# Patient Record
Sex: Female | Born: 1965 | Hispanic: Yes | Marital: Married | Smoking: Never smoker
Health system: Southern US, Community
[De-identification: ages and names within clinical notes are randomized; demographics above are authoritative.]

## PROBLEM LIST (undated history)

## (undated) HISTORY — PX: APPENDECTOMY: SHX54

---

## 2017-07-03 DIAGNOSIS — M5441 Lumbago with sciatica, right side: Secondary | ICD-10-CM | POA: Diagnosis not present

## 2017-07-03 DIAGNOSIS — M542 Cervicalgia: Secondary | ICD-10-CM | POA: Diagnosis not present

## 2017-07-07 DIAGNOSIS — E785 Hyperlipidemia, unspecified: Secondary | ICD-10-CM | POA: Diagnosis not present

## 2017-07-07 DIAGNOSIS — R5383 Other fatigue: Secondary | ICD-10-CM | POA: Diagnosis not present

## 2017-07-07 DIAGNOSIS — Z79899 Other long term (current) drug therapy: Secondary | ICD-10-CM | POA: Diagnosis not present

## 2017-07-18 DIAGNOSIS — M5441 Lumbago with sciatica, right side: Secondary | ICD-10-CM | POA: Diagnosis not present

## 2017-07-18 DIAGNOSIS — M542 Cervicalgia: Secondary | ICD-10-CM | POA: Diagnosis not present

## 2017-07-24 DIAGNOSIS — R1031 Right lower quadrant pain: Secondary | ICD-10-CM | POA: Diagnosis not present

## 2017-07-24 DIAGNOSIS — J45909 Unspecified asthma, uncomplicated: Secondary | ICD-10-CM | POA: Diagnosis not present

## 2017-07-24 DIAGNOSIS — R197 Diarrhea, unspecified: Secondary | ICD-10-CM | POA: Diagnosis not present

## 2017-07-24 DIAGNOSIS — K3580 Unspecified acute appendicitis: Secondary | ICD-10-CM | POA: Diagnosis not present

## 2017-07-24 DIAGNOSIS — Z79899 Other long term (current) drug therapy: Secondary | ICD-10-CM | POA: Diagnosis not present

## 2017-07-24 DIAGNOSIS — K353 Acute appendicitis with localized peritonitis, without perforation or gangrene: Secondary | ICD-10-CM | POA: Diagnosis not present

## 2017-07-25 DIAGNOSIS — K3580 Unspecified acute appendicitis: Secondary | ICD-10-CM | POA: Diagnosis not present

## 2017-07-25 DIAGNOSIS — R1031 Right lower quadrant pain: Secondary | ICD-10-CM | POA: Diagnosis not present

## 2017-07-25 DIAGNOSIS — J45909 Unspecified asthma, uncomplicated: Secondary | ICD-10-CM | POA: Diagnosis not present

## 2017-07-25 DIAGNOSIS — Z79899 Other long term (current) drug therapy: Secondary | ICD-10-CM | POA: Diagnosis not present

## 2017-07-25 DIAGNOSIS — K353 Acute appendicitis with localized peritonitis, without perforation or gangrene: Secondary | ICD-10-CM | POA: Diagnosis not present

## 2017-07-26 DIAGNOSIS — J45909 Unspecified asthma, uncomplicated: Secondary | ICD-10-CM | POA: Diagnosis not present

## 2017-07-26 DIAGNOSIS — K353 Acute appendicitis with localized peritonitis, without perforation or gangrene: Secondary | ICD-10-CM | POA: Diagnosis not present

## 2017-07-26 DIAGNOSIS — Z79899 Other long term (current) drug therapy: Secondary | ICD-10-CM | POA: Diagnosis not present

## 2017-07-26 DIAGNOSIS — K3580 Unspecified acute appendicitis: Secondary | ICD-10-CM | POA: Diagnosis not present

## 2017-07-28 DIAGNOSIS — R05 Cough: Secondary | ICD-10-CM | POA: Diagnosis not present

## 2017-07-28 DIAGNOSIS — Z6830 Body mass index (BMI) 30.0-30.9, adult: Secondary | ICD-10-CM | POA: Diagnosis not present

## 2017-07-28 DIAGNOSIS — B9689 Other specified bacterial agents as the cause of diseases classified elsewhere: Secondary | ICD-10-CM | POA: Diagnosis not present

## 2017-07-28 DIAGNOSIS — J208 Acute bronchitis due to other specified organisms: Secondary | ICD-10-CM | POA: Diagnosis not present

## 2017-07-29 DIAGNOSIS — R112 Nausea with vomiting, unspecified: Secondary | ICD-10-CM | POA: Diagnosis not present

## 2017-07-29 DIAGNOSIS — R51 Headache: Secondary | ICD-10-CM | POA: Diagnosis not present

## 2017-08-11 DIAGNOSIS — J208 Acute bronchitis due to other specified organisms: Secondary | ICD-10-CM | POA: Diagnosis not present

## 2017-08-11 DIAGNOSIS — B9689 Other specified bacterial agents as the cause of diseases classified elsewhere: Secondary | ICD-10-CM | POA: Diagnosis not present

## 2017-08-11 DIAGNOSIS — M542 Cervicalgia: Secondary | ICD-10-CM | POA: Diagnosis not present

## 2017-12-29 DIAGNOSIS — R062 Wheezing: Secondary | ICD-10-CM | POA: Diagnosis not present

## 2017-12-29 DIAGNOSIS — J040 Acute laryngitis: Secondary | ICD-10-CM | POA: Diagnosis not present

## 2018-01-04 DIAGNOSIS — J028 Acute pharyngitis due to other specified organisms: Secondary | ICD-10-CM | POA: Diagnosis not present

## 2018-01-04 DIAGNOSIS — B9689 Other specified bacterial agents as the cause of diseases classified elsewhere: Secondary | ICD-10-CM | POA: Diagnosis not present

## 2018-03-28 DIAGNOSIS — M5126 Other intervertebral disc displacement, lumbar region: Secondary | ICD-10-CM | POA: Diagnosis not present

## 2018-03-28 DIAGNOSIS — M5441 Lumbago with sciatica, right side: Secondary | ICD-10-CM | POA: Diagnosis not present

## 2018-03-28 DIAGNOSIS — M7601 Gluteal tendinitis, right hip: Secondary | ICD-10-CM | POA: Diagnosis not present

## 2018-03-28 DIAGNOSIS — M47816 Spondylosis without myelopathy or radiculopathy, lumbar region: Secondary | ICD-10-CM | POA: Diagnosis not present

## 2018-03-28 DIAGNOSIS — M25551 Pain in right hip: Secondary | ICD-10-CM | POA: Diagnosis not present

## 2018-03-29 ENCOUNTER — Emergency Department (HOSPITAL_COMMUNITY): Payer: Commercial Managed Care - PPO

## 2018-03-29 ENCOUNTER — Encounter: Payer: Self-pay | Admitting: Emergency Medicine

## 2018-03-29 ENCOUNTER — Emergency Department (HOSPITAL_COMMUNITY)
Admission: EM | Admit: 2018-03-29 | Discharge: 2018-03-30 | Disposition: A | Discharge status: Home / Self Care | Payer: Commercial Managed Care - PPO | Attending: Emergency Medicine | Admitting: Emergency Medicine

## 2018-03-29 DIAGNOSIS — Y939 Activity, unspecified: Secondary | ICD-10-CM | POA: Insufficient documentation

## 2018-03-29 DIAGNOSIS — M25551 Pain in right hip: Secondary | ICD-10-CM

## 2018-03-29 DIAGNOSIS — S73101A Unspecified sprain of right hip, initial encounter: Secondary | ICD-10-CM | POA: Diagnosis not present

## 2018-03-29 DIAGNOSIS — S73191A Other sprain of right hip, initial encounter: Secondary | ICD-10-CM | POA: Insufficient documentation

## 2018-03-29 DIAGNOSIS — Y929 Unspecified place or not applicable: Secondary | ICD-10-CM | POA: Insufficient documentation

## 2018-03-29 DIAGNOSIS — S79911A Unspecified injury of right hip, initial encounter: Secondary | ICD-10-CM | POA: Diagnosis present

## 2018-03-29 DIAGNOSIS — X509XXA Other and unspecified overexertion or strenuous movements or postures, initial encounter: Secondary | ICD-10-CM | POA: Insufficient documentation

## 2018-03-29 DIAGNOSIS — S71011A Laceration without foreign body, right hip, initial encounter: Secondary | ICD-10-CM | POA: Diagnosis not present

## 2018-03-29 DIAGNOSIS — T148XXA Other injury of unspecified body region, initial encounter: Secondary | ICD-10-CM

## 2018-03-29 DIAGNOSIS — Y999 Unspecified external cause status: Secondary | ICD-10-CM | POA: Diagnosis not present

## 2018-03-29 LAB — CBC WITH DIFFERENTIAL/PLATELET
ABS IMMATURE GRANULOCYTES: 0.02 10*3/uL (ref 0.00–0.07)
Basophils Absolute: 0 10*3/uL (ref 0.0–0.1)
Basophils Relative: 0 %
Eosinophils Absolute: 0.1 10*3/uL (ref 0.0–0.5)
Eosinophils Relative: 1 %
HCT: 39 % (ref 36.0–46.0)
HEMOGLOBIN: 12.3 g/dL (ref 12.0–15.0)
IMMATURE GRANULOCYTES: 0 %
Lymphocytes Relative: 33 %
Lymphs Abs: 2.2 10*3/uL (ref 0.7–4.0)
MCH: 27.9 pg (ref 26.0–34.0)
MCHC: 31.5 g/dL (ref 30.0–36.0)
MCV: 88.4 fL (ref 80.0–100.0)
MONO ABS: 0.6 10*3/uL (ref 0.1–1.0)
Monocytes Relative: 8 %
Neutro Abs: 3.9 10*3/uL (ref 1.7–7.7)
Neutrophils Relative %: 58 %
Platelets: 270 10*3/uL (ref 150–400)
RBC: 4.41 MIL/uL (ref 3.87–5.11)
RDW: 13.9 % (ref 11.5–15.5)
WBC: 6.7 10*3/uL (ref 4.0–10.5)
nRBC: 0 % (ref 0.0–0.2)

## 2018-03-29 LAB — I-STAT BETA HCG BLOOD, ED (MC, WL, AP ONLY): I-stat hCG, quantitative: <5 m[IU]/mL (ref <5)

## 2018-03-29 LAB — BASIC METABOLIC PANEL
ANION GAP: 11 (ref 5–15)
BUN: 10 mg/dL (ref 6–20)
CALCIUM: 9.4 mg/dL (ref 8.9–10.3)
CHLORIDE: 105 mmol/L (ref 98–111)
CO2: 24 mmol/L (ref 22–32)
Creatinine, Ser: 0.66 mg/dL (ref 0.44–1.00)
GFR calc non Af Amer: >60 mL/min (ref >60)
Glucose, Bld: 80 mg/dL (ref 70–99)
Potassium: 3.7 mmol/L (ref 3.5–5.1)
SODIUM: 140 mmol/L (ref 135–145)

## 2018-03-29 LAB — SEDIMENTATION RATE: SED RATE: 54 mm/h — AB (ref 0–22)

## 2018-03-29 MED ORDER — HYDROCODONE-ACETAMINOPHEN 5-325 MG PO TABS
1.0000 | ORAL_TABLET | Freq: Once | ORAL | Status: AC
  Administered 2018-03-29: 1 via ORAL
  Filled 2018-03-29: qty 1

## 2018-03-29 MED ORDER — DIAZEPAM 2 MG PO TABS
2.0000 mg | ORAL_TABLET | Freq: Once | ORAL | Status: AC
  Administered 2018-03-29: 2 mg via ORAL
  Filled 2018-03-29: qty 1

## 2018-03-29 NOTE — Discharge Instructions (Signed)
Please see the information and instructions below regarding your visit.  Your diagnoses today include:  1. Right hip pain   2. Muscle tear   3. Tear of right acetabular labrum, initial encounter    Your work-up is very reassuring today.  Your MRI shows that she has some muscle tearing of the muscles of the right hip, and possible tear in the labrum, which is a coating around the joint.   Tests performed today include: See side panel of your discharge paperwork for testing performed today. Vital signs are listed at the bottom of these instructions.   Medications prescribed:    Take any prescribed medications only as prescribed, and any over the counter medications only as directed on the packaging.  Please continue to take the meloxicam as prescribed by [facility].  You may take 650 mg of Tylenol every 6 hours as needed for pain as well.  Do not exceed 4000 mg in 1 day.  Home care instructions:  Please follow any educational materials contained in this packet.   Please alternate ice and heat overlying the hip.  When you ice, do not apply for more than 20 minutes at a time, and place a towel in between your skin and the ice.   Follow-up instructions:  Please follow up with Dr. Jena Gauss of orthopedic surgery as soon as possible.  Return instructions:  Please return to the Emergency Department if you experience worsening symptoms.  Please return to the emergency department if you develop any worsening pain, redness or swelling around the hip joint, fever or chills with hip pain, or complete inability to move the hip. Please return if you have any other emergent concerns.  Additional Information:   Your vital signs today were: BP 121/76    Pulse 73    Temp 98.8 F (37.1 C) (Oral)    Resp 16    Ht 5\' 6"  (1.676 m)    Wt 80.7 kg    SpO2 99%    BMI 28.73 kg/m  If your blood pressure (BP) was elevated on multiple readings during this visit above 130 for the top number or above 80  for the bottom number, please have this repeated by your primary care provider within one month. --------------  Thank you for allowing [place] to participate in your care today.

## 2018-03-29 NOTE — ED Notes (Signed)
ED Provider at bedside. 

## 2018-03-29 NOTE — ED Notes (Signed)
Patient transported to MRI 

## 2018-03-29 NOTE — ED Triage Notes (Signed)
Pt presents with 3 day h/o R groin and hip pain.  Pt reports awakening with pain, denies any injury.  Pt was seen by PCP and referred to [facility] where CT was done.  Pt presents with imaging disc.

## 2018-03-29 NOTE — ED Provider Notes (Signed)
[facility] EMERGENCY DEPARTMENT Provider Note   CSN: [id number] Arrival date & time: 03/29/18  1329     History   Chief Complaint Chief Complaint  Patient presents with  . Groin Pain    HPI Savannah Warren is a 52 y.o. female.  HPI  Patient is a 52 year old female with no significant past medical history presenting for sudden onset right hip pain 2 days ago.  Patient reports that she woke up in the morning 2 days ago with severe pain in her anterior "deep inside" right hip that she reports as made her unable to walk for the past 2 days.  Patient reports he was evaluated by her primary care provider, who performed radiographs, and there was question of a possible compression fracture in the lumbar spine.  Patient was then referred to [facility] where she had CT of lumbar spine and CT WO contrast right lower extremity.  They identified calcified gluteal tendinopathy, but no signs of AVN, or acute bony abnormality of the hip.  Patient denies any fever or chills, erythema or warmth to the joint, lower abdominal pain, dysuria, urgency, or frequency.  Patient denies any history of diabetes, immunocompromise status, or IVDU.  Patient was prescribed meloxicam for the pain without relief.  History reviewed. No pertinent past medical history.  There are no active problems to display for this patient.   Past Surgical History:  Procedure Laterality Date  . APPENDECTOMY       OB History   None      Home Medications    Prior to Admission medications   Not on File    Family History History reviewed. No pertinent family history.  Social History Social History   Tobacco Use  . Smoking status: Never Smoker  . Smokeless tobacco: Never Used  Substance Use Topics  . Alcohol use: Not on file  . Drug use: Not on file     Allergies   Patient has no known allergies.   Review of Systems Review of Systems  Constitutional: Negative for chills and fever.   HENT: Negative for congestion and sore throat.   Eyes: Negative for visual disturbance.  Respiratory: Negative for cough, chest tightness and shortness of breath.   Cardiovascular: Negative for chest pain and leg swelling.  Gastrointestinal: Negative for abdominal pain, nausea and vomiting.  Genitourinary: Negative for dysuria and flank pain.  Musculoskeletal: Positive for arthralgias and myalgias. Negative for back pain and joint swelling.  Skin: Negative for rash.  Neurological: Negative for dizziness and syncope.     Physical Exam Updated Vital Signs BP 121/76   Pulse 70   Temp 98.8 F (37.1 C) (Oral)   Resp 16   Ht '5\' 6"'$  (1.676 m)   Wt 80.7 kg   SpO2 96%   BMI 28.73 kg/m   Physical Exam  Constitutional: She appears well-developed and well-nourished. No distress.  HENT:  Head: Normocephalic and atraumatic.  Mouth/Throat: Oropharynx is clear and moist.  Eyes: Pupils are equal, round, and reactive to light. Conjunctivae and EOM are normal.  Neck: Normal range of motion. Neck supple.  Cardiovascular: Normal rate, regular rhythm, S1 normal and S2 normal.  No murmur heard. Pulmonary/Chest: Effort normal and breath sounds normal. She has no wheezes. She has no rales.  Abdominal: Soft. She exhibits no distension. There is no tenderness. There is no guarding.  Musculoskeletal:  Right lower extremity exam: Patient has no tenderness to palpation of the anterior or posterior hip.  Reported  during exam the pain is "deep" inside.  Patient has exquisite pain to logrolling, and with external rotation maneuvers.  Patient also has exquisite pain to resisted extension of the hip.  Knee flexion and extension, and dorsiflexion and plantarflexion are intact to the right lower extremity.  Full sensation of the distal right lower extremity.  2+ DP and PT pulses of the right lower extremity.  Neurological: She is alert.  Cranial nerves grossly intact. Patient moves extremities symmetrically and  with good coordination.  Skin: Skin is warm and dry. No rash noted. No erythema.  Psychiatric: She has a normal mood and affect. Her behavior is normal. Judgment and thought content normal.  Nursing note and vitals reviewed.    ED Treatments / Results  Labs (all labs ordered are listed, but only abnormal results are displayed) Labs Reviewed  CBC WITH DIFFERENTIAL/PLATELET  URINALYSIS, ROUTINE W REFLEX MICROSCOPIC  BASIC METABOLIC PANEL  I-STAT BETA HCG BLOOD, ED ([facility], [facility], [facility] ONLY)    EKG None  Radiology Mr Hip Right Wo Contrast  Result Date: 03/29/2018 CLINICAL DATA:  Right groin pain.  No specific injury. EXAM: MR OF THE RIGHT HIP WITHOUT CONTRAST TECHNIQUE: Multiplanar, multisequence MR imaging was performed. No intravenous contrast was administered. COMPARISON:  None. FINDINGS: There are muscle tears surrounding the right hip. This mainly involves the gemellus, operator internus and quadratus femoris muscles. There is also mild peritendinosis and mild trochanteric bursitis. Both hips are normally located. No stress fracture or AVN. No hip joint effusion. No periarticular fluid collections to suggest a paralabral cyst. On the coronal and sagittal images findings suspicious for an anterior superior labral tear. No significant intrapelvic abnormalities are identified. The pubic symphysis and SI joints are intact. No pelvic fractures or bone lesions. IMPRESSION: 1. Muscle strain/partial tears involving the right hip muscles as detailed above. 2. No hip fracture or AVN.  No pelvic fractures. 3. Suspect superior anterior labral tear involving the right hip. Electronically Signed   By: Marijo Sanes M.D.   On: 03/29/2018 20:39    Procedures Procedures (including critical care time)  Medications Ordered in ED Medications - No data to display   Initial Impression / Assessment and Plan / ED Course  I have reviewed the triage vital signs and the nursing notes.  Pertinent labs & imaging  results that were available during my care of the patient were reviewed by me and considered in my medical decision making (see chart for details).  Clinical Course as of Mar 29 2334  Thu Mar 29, 2018  1744 Consulted with Dr. Doreatha Martin of orthopedic surgery.  Based on presentation, recommends MRI.  Appreciate his involvement in the care of this patient.   [AM]  1804 Consulted with radiologist, Dr. Randel Pigg, who based on patient's CT readings from yesterday does not recommend MRI with contrast.  We will proceed with ordering MRI without contrast.   [AM]    Clinical Course User Index [AM] Albesa Seen, PA-C    Patient is nontoxic-appearing, afebrile, and in no acute distress.  Examination not consistent with septic arthritis, and patient has no major risk factors for osteomyelitis.  Patient has documented exams without evidence of AVN yesterday.  The frontal diagnosis includes occult stress fracture, labrum tear, piriformis syndrome, tendinopathy, or muscle tearing.  MRI performed without contrast on demonstrating muscle tears at the gemellus, operating tenderness and quadratus femoris muscles.  Suspect possible ligament tear.  Lab work demonstrated no leukocytosis.  Mild elevation in ESR, nonspecific.  This was discussed with attending physician, Dr. Pryor Curia, and felt unlikely to be related to infectious process.  Likely false positive.  CRP is pending, and care signed out to Savannah Moras, PA-C at shift change to follow CRP but if non-elevated/minimally elevated, patient stable for discharge.   Patient to follow-up with Dr. Doreatha Martin of orthopedics and sports medicine.  Patient can return precautions any worsening pain, swelling or redness of the hip, loss of range of motion, or fever or chills.  Patient and family are in understanding and agree with plan of care.  This is a shared visit with Dr. Pattricia Boss. Patient was independently evaluated by this attending physician. Attending physician  consulted in evaluation and management.  Final Clinical Impressions(s) / ED Diagnoses   Final diagnoses:  Right hip pain  Muscle tear  Tear of right acetabular labrum, initial encounter    ED Discharge Orders    None       Tamala Julian 03/29/18 2349    Pattricia Boss, MD 03/30/18 1426

## 2018-03-29 NOTE — ED Notes (Signed)
Pt returned from MRI °

## 2018-03-29 NOTE — ED Provider Notes (Signed)
Patient placed in Quick Look pathway, seen and evaluated   Chief Complaint: right groin pain  HPI:  Savannah Warren is a 52 y.o. female who presents to the ED with right groin pain. The pain started 3 days ago. Patient with no known injury. Reports she woke with the pain. She did have similar pain a couple years ago when she pulled a muscle. Patient went to [facility] to see her PCP and was sent to [place] and had x-rays. Patient also had CT scan. Patient here for second opinion and request MRI.   ROS: M/S: right hip and groin pain  Physical Exam:  BP 109/73 (BP Location: Right Arm)   Pulse 66   Temp 98.8 F (37.1 C) (Oral)   Resp 16   Ht 5\' 6"  (1.676 m)   Wt 80.7 kg   SpO2 100%   BMI 28.73 kg/m    Gen: No distress  Neuro: Awake and Alert  Skin: Warm and dry  M/S: I am unable to reproduce the pain that the patient reports she has when she stands and walks.    Initiation of care has begun. The patient has been counseled on the process, plan, and necessity for staying for the completion/evaluation, and the remainder of the medical screening examination    Janne Napoleon, NP 03/29/18 1451    Lorre Nick, MD 03/30/18 [phone]

## 2018-03-30 LAB — C-REACTIVE PROTEIN: CRP: 5.1 mg/dL — AB (ref <1.0)

## 2018-03-30 MED ORDER — HYDROCODONE-ACETAMINOPHEN 5-325 MG PO TABS: 1.0000 | ORAL_TABLET | Freq: Four times a day (QID) | ORAL | 0 refills | Status: DC | PRN

## 2018-03-30 NOTE — ED Provider Notes (Signed)
Received signout at the beginning of shift.  This is a 52 year old female without any significant past medical history presenting with acute onset of right hip pain which started 2 to 3 days ago.  Pain is intense, she is having difficulty walking.  Orthopedic, Dr. Jena Gauss who recommends MRI for evaluation as well as sed rate and CRP.  MRI shows suspected superior anterior labral tear involving the right hip.  Both sed rate and CRP are elevated.  Will consult orthopedic for further recommendation.  12:19 AM Appreciate consultation from ortho Dr. Jena Gauss who had reviewed MRI result and acknowledge SED RATE and CRP.  He felt sxs likely due to muscle tear.  He recommends NsaidS and opiate for sxs control.  Pt can f/u outpt as needed.  No further work up necessary.  Pt is made aware of finding and agrees with plan.   In order to decrease risk of narcotic abuse. Pt's record were checked using the  Controlled Substance database.   BP 121/76   Pulse 73   Temp 98.8 F (37.1 C) (Oral)   Resp 16   Ht 5\' 6"  (1.676 m)   Wt 80.7 kg   SpO2 99%   BMI 28.73 kg/m   Results for orders placed or performed during the hospital encounter of 03/29/18  CBC with Differential  Result Value Ref Range   WBC 6.7 4.0 - 10.5 K/uL   RBC 4.41 3.87 - 5.11 MIL/uL   Hemoglobin 12.3 12.0 - 15.0 g/dL   HCT 96.0 45.4 - 09.8 %   MCV 88.4 80.0 - 100.0 fL   MCH 27.9 26.0 - 34.0 pg   MCHC 31.5 30.0 - 36.0 g/dL   RDW 11.9 14.7 - 82.9 %   Platelets 270 150 - 400 K/uL   nRBC 0.0 0.0 - 0.2 %   Neutrophils Relative % 58 %   Neutro Abs 3.9 1.7 - 7.7 K/uL   Lymphocytes Relative 33 %   Lymphs Abs 2.2 0.7 - 4.0 K/uL   Monocytes Relative 8 %   Monocytes Absolute 0.6 0.1 - 1.0 K/uL   Eosinophils Relative 1 %   Eosinophils Absolute 0.1 0.0 - 0.5 K/uL   Basophils Relative 0 %   Basophils Absolute 0.0 0.0 - 0.1 K/uL   Immature Granulocytes 0 %   Abs Immature Granulocytes 0.02 0.00 - 0.07 K/uL  Basic metabolic panel  Result  Value Ref Range   Sodium 140 135 - 145 mmol/L   Potassium 3.7 3.5 - 5.1 mmol/L   Chloride 105 98 - 111 mmol/L   CO2 24 22 - 32 mmol/L   Glucose, Bld 80 70 - 99 mg/dL   BUN 10 6 - 20 mg/dL   Creatinine, Ser 5.62 0.44 - 1.00 mg/dL   Calcium 9.4 8.9 - 13.0 mg/dL   GFR calc non Af Amer >60 >60 mL/min   GFR calc Af Amer >60 >60 mL/min   Anion gap 11 5 - 15  C-reactive protein  Result Value Ref Range   CRP 5.1 (H) <1.0 mg/dL  Sedimentation rate  Result Value Ref Range   Sed Rate 54 (H) 0 - 22 mm/hr  I-Stat Beta hCG blood, ED ([facility], [facility], [facility] only)  Result Value Ref Range   I-stat hCG, quantitative <5.0 <5 mIU/mL   Comment 3           Mr Hip Right Wo Contrast  Result Date: 03/29/2018 CLINICAL DATA:  Right groin pain.  No specific injury. EXAM: MR OF THE  RIGHT HIP WITHOUT CONTRAST TECHNIQUE: Multiplanar, multisequence MR imaging was performed. No intravenous contrast was administered. COMPARISON:  None. FINDINGS: There are muscle tears surrounding the right hip. This mainly involves the gemellus, operator internus and quadratus femoris muscles. There is also mild peritendinosis and mild trochanteric bursitis. Both hips are normally located. No stress fracture or AVN. No hip joint effusion. No periarticular fluid collections to suggest a paralabral cyst. On the coronal and sagittal images findings suspicious for an anterior superior labral tear. No significant intrapelvic abnormalities are identified. The pubic symphysis and SI joints are intact. No pelvic fractures or bone lesions. IMPRESSION: 1. Muscle strain/partial tears involving the right hip muscles as detailed above. 2. No hip fracture or AVN.  No pelvic fractures. 3. Suspect superior anterior labral tear involving the right hip. Electronically Signed   By: Rudie Meyer M.D.   On: 03/29/2018 20:39       Fayrene Helper, PA-C 03/30/18 0030    Terrilee Files, MD 03/30/18 [phone]

## 2018-04-16 DIAGNOSIS — S76011A Strain of muscle, fascia and tendon of right hip, initial encounter: Secondary | ICD-10-CM | POA: Diagnosis not present

## 2018-12-28 DIAGNOSIS — R911 Solitary pulmonary nodule: Secondary | ICD-10-CM | POA: Insufficient documentation

## 2020-12-27 ENCOUNTER — Emergency Department (HOSPITAL_COMMUNITY): Payer: Commercial Managed Care - PPO

## 2020-12-27 ENCOUNTER — Other Ambulatory Visit: Payer: Self-pay

## 2020-12-27 ENCOUNTER — Encounter (HOSPITAL_COMMUNITY): Payer: Self-pay | Admitting: Emergency Medicine

## 2020-12-27 ENCOUNTER — Emergency Department (HOSPITAL_COMMUNITY)
Admission: EM | Admit: 2020-12-27 | Discharge: 2020-12-27 | Disposition: A | Discharge status: Home / Self Care | Payer: Commercial Managed Care - PPO | Attending: Emergency Medicine | Admitting: Emergency Medicine

## 2020-12-27 DIAGNOSIS — R1032 Left lower quadrant pain: Secondary | ICD-10-CM | POA: Insufficient documentation

## 2020-12-27 DIAGNOSIS — M549 Dorsalgia, unspecified: Secondary | ICD-10-CM

## 2020-12-27 DIAGNOSIS — M5432 Sciatica, left side: Secondary | ICD-10-CM

## 2020-12-27 DIAGNOSIS — M545 Low back pain, unspecified: Secondary | ICD-10-CM | POA: Insufficient documentation

## 2020-12-27 LAB — CBC WITH DIFFERENTIAL/PLATELET
Abs Immature Granulocytes: 0.04 10*3/uL (ref 0.00–0.07)
Basophils Absolute: 0 10*3/uL (ref 0.0–0.1)
Basophils Relative: 0 %
Eosinophils Absolute: 0.1 10*3/uL (ref 0.0–0.5)
Eosinophils Relative: 2 %
HCT: 39.8 % (ref 36.0–46.0)
Hemoglobin: 12.9 g/dL (ref 12.0–15.0)
Immature Granulocytes: 1 %
Lymphocytes Relative: 36 %
Lymphs Abs: 2.3 10*3/uL (ref 0.7–4.0)
MCH: 28.2 pg (ref 26.0–34.0)
MCHC: 32.4 g/dL (ref 30.0–36.0)
MCV: 86.9 fL (ref 80.0–100.0)
Monocytes Absolute: 0.5 10*3/uL (ref 0.1–1.0)
Monocytes Relative: 7 %
Neutro Abs: 3.5 10*3/uL (ref 1.7–7.7)
Neutrophils Relative %: 54 %
Platelets: 289 10*3/uL (ref 150–400)
RBC: 4.58 MIL/uL (ref 3.87–5.11)
RDW: 13.2 % (ref 11.5–15.5)
WBC: 6.4 10*3/uL (ref 4.0–10.5)
nRBC: 0 % (ref 0.0–0.2)

## 2020-12-27 LAB — URINALYSIS, ROUTINE W REFLEX MICROSCOPIC
Bilirubin Urine: NEGATIVE
Glucose, UA: NEGATIVE mg/dL
Hgb urine dipstick: NEGATIVE
Ketones, ur: NEGATIVE mg/dL
Leukocytes,Ua: NEGATIVE
Nitrite: NEGATIVE
Protein, ur: NEGATIVE mg/dL
Specific Gravity, Urine: 1.003 — ABNORMAL LOW (ref 1.005–1.030)
pH: 8 (ref 5.0–8.0)

## 2020-12-27 LAB — BASIC METABOLIC PANEL
Anion gap: 10 (ref 5–15)
BUN: 17 mg/dL (ref 6–20)
CO2: 22 mmol/L (ref 22–32)
Calcium: 9.2 mg/dL (ref 8.9–10.3)
Chloride: 104 mmol/L (ref 98–111)
Creatinine, Ser: 0.61 mg/dL (ref 0.44–1.00)
GFR, Estimated: >60 mL/min (ref >60)
Glucose, Bld: 93 mg/dL (ref 70–99)
Potassium: 3 mmol/L — ABNORMAL LOW (ref 3.5–5.1)
Sodium: 136 mmol/L (ref 135–145)

## 2020-12-27 LAB — I-STAT BETA HCG BLOOD, ED (MC, WL, AP ONLY): I-stat hCG, quantitative: <5 m[IU]/mL (ref <5)

## 2020-12-27 MED ORDER — OXYCODONE HCL 5 MG PO TABS: 5.0000 mg | ORAL_TABLET | ORAL | 0 refills | Status: AC | PRN

## 2020-12-27 MED ORDER — ACETAMINOPHEN 325 MG PO TABS: 650.0000 mg | ORAL_TABLET | Freq: Four times a day (QID) | ORAL | 0 refills | Status: AC | PRN

## 2020-12-27 MED ORDER — PREDNISONE 10 MG PO TABS: 40.0000 mg | ORAL_TABLET | Freq: Every day | ORAL | 0 refills | Status: AC

## 2020-12-27 MED ORDER — HYDROMORPHONE HCL 1 MG/ML IJ SOLN
1.0000 mg | Freq: Once | INTRAMUSCULAR | Status: AC
  Administered 2020-12-27: 1 mg via INTRAVENOUS
  Filled 2020-12-27: qty 1

## 2020-12-27 MED ORDER — DOCUSATE SODIUM 100 MG PO CAPS: 100.0000 mg | ORAL_CAPSULE | Freq: Every day | ORAL | 0 refills | Status: AC | PRN

## 2020-12-27 MED ORDER — IOHEXOL 350 MG/ML SOLN
100.0000 mL | Freq: Once | INTRAVENOUS | Status: AC | PRN
  Administered 2020-12-27: 100 mL via INTRAVENOUS

## 2020-12-27 NOTE — ED Provider Notes (Signed)
[facility] EMERGENCY DEPARTMENT Provider Note   CSN: [id number] Arrival date & time: 12/27/20  1324     History YC:XKGY pain   Savannah Warren is a 55 y.o. female presented emergency department with left lower back pain and flank pain.  Patient reports abrupt onset of symptoms today when she is getting out of the shower.  She said the pain was so severe it dropped her to the ground.  She has never had it before.  It is a stabbing pain in her left flank that radiates towards her left buttock.  She denies dysuria, hematuria, history of kidney stones.  She denies any falls or trauma.  She reports the pain is 10 out of 10, worse with movement, better when laying completely still.  Patient is primarily Spanish-speaking, to speak some [place], her husband by phone helps with translation.  She otherwise has no subsequent medical problems.  The pain does not radiate down her leg or travel anywhere.  HPI     History reviewed. No pertinent past medical history.  There are no problems to display for this patient.   Past Surgical History:  Procedure Laterality Date   APPENDECTOMY       OB History   No obstetric history on file.     No family history on file.  Social History   Tobacco Use   Smoking status: Never   Smokeless tobacco: Never  Substance Use Topics   Alcohol use: Not Currently   Drug use: Not Currently    Home Medications Prior to Admission medications   Medication Sig Start Date End Date Taking? Authorizing Provider  HYDROcodone-acetaminophen (NORCO/VICODIN) 5-325 MG tablet Take 1 tablet by mouth every 6 (six) hours as needed for moderate pain or severe pain. 03/30/18   Fayrene Helper, PA-C  meloxicam (MOBIC) 7.5 MG tablet Take 7.5 mg by mouth 2 (two) times daily. 03/28/18   [provider]    Allergies    Patient has no known allergies.  Review of Systems   Review of Systems  Constitutional:  Negative for chills and fever.  HENT:   Negative for ear pain and sore throat.   Eyes:  Negative for pain and visual disturbance.  Respiratory:  Negative for cough and shortness of breath.   Cardiovascular:  Negative for chest pain and palpitations.  Gastrointestinal:  Negative for abdominal pain and vomiting.  Genitourinary:  Negative for dysuria and hematuria.  Musculoskeletal:  Positive for arthralgias and back pain.  Skin:  Negative for color change and rash.  Neurological:  Negative for weakness and numbness.  All other systems reviewed and are negative.  Physical Exam Updated Vital Signs BP (!) 128/102   Pulse 84   Temp 98.4 F (36.9 C) (Oral)   Resp 17   SpO2 90%   Physical Exam Constitutional:      General: She is not in acute distress. HENT:     Head: Normocephalic and atraumatic.  Eyes:     Extraocular Movements: Extraocular movements intact.     Conjunctiva/sclera: Conjunctivae normal.     Pupils: Pupils are equal, round, and reactive to light.  Cardiovascular:     Rate and Rhythm: Normal rate and regular rhythm.     Pulses: Normal pulses.  Pulmonary:     Effort: Pulmonary effort is normal. No respiratory distress.  Abdominal:     General: There is no distension.     Tenderness: There is no abdominal tenderness.  Skin:    General:  Skin is warm and dry.  Neurological:     General: No focal deficit present.     Mental Status: She is alert and oriented to person, place, and time. Mental status is at baseline.     Sensory: No sensory deficit.     Motor: No weakness.     Comments: Pain worse with forward flexion at the hip  Psychiatric:        Mood and Affect: Mood normal.        Behavior: Behavior normal.    ED Results / Procedures / Treatments   Labs (all labs ordered are listed, but only abnormal results are displayed) Labs Reviewed  BASIC METABOLIC PANEL - Abnormal; Notable for the following components:      Result Value   Potassium 3.0 (*)    All other components within normal limits   CBC WITH DIFFERENTIAL/PLATELET  URINALYSIS, ROUTINE W REFLEX MICROSCOPIC  I-STAT BETA HCG BLOOD, ED ([facility], [facility], [facility] ONLY)    EKG None  Radiology No results found.  Procedures Procedures   Medications Ordered in ED Medications  HYDROmorphone (DILAUDID) injection 1 mg (1 mg Intravenous Given 12/27/20 1611)    ED Course  I have reviewed the triage vital signs and the nursing notes.  Pertinent labs & imaging results that were available during my care of the patient were reviewed by me and considered in my medical decision making (see chart for details).  Patient is here with left hip and flank pain.  Differential diagnosis includes sciatica or radiculopathy (most likely given a positional this is) versus ureteral colic and kidney stone versus other.  Otherwise clinically I doubt this is a AAA.  I have a low suspicion for intra-abdominal perforation or sepsis.  No red flags for cauda equina syndrome.  Labs show mild hypokalemia, no leukocytosis.  UA is pending.  IV Dilaudid given for pain.  CT scans ordered and pending.  UA without sign of infection.  Clinical Course as of 12/27/20 2258  Wynelle Link Dec 27, 2020  1806 Patient reports significant improvement of her pain.  She was able to ambulate steadily.  I spoke with both her and her husband on the phone, provided medications for pain control, she already sees a spine doctor oral and will follow up with him for this issue. [MT]  1810 Patient reports she cannot have NSAIDs because she has ulcers in her stomach. [MT]    Clinical Course User Index [MT] Lucielle Vokes, Kermit Balo, MD    Final Clinical Impression(s) / ED Diagnoses Final diagnoses:  Back pain    Rx / DC Orders ED Discharge Orders     None        Terald Sleeper, MD 12/27/20 2259

## 2020-12-27 NOTE — ED Notes (Signed)
Patient is resting comfortably. States pain much improved. All needs being met. VSS

## 2020-12-27 NOTE — Discharge Instructions (Addendum)
Please call your spine doctor's office tomorrow and ask for a follow up appointment.

## 2020-12-27 NOTE — ED Provider Notes (Signed)
Emergency Medicine Provider Triage Evaluation Note  Clyde Upshaw , a 55 y.o. female  was evaluated in triage.  Pt complains of flank pain. Began 30 min PTA. Located to left flank. No abd pain. Will occasional radiate into left glut and into leg. No numbness, weakness, IVDU, saddle paresthesias. Worse with movement. No hx of AAA, dissection. No CP, SOB, urinary complaints  Review of Systems  Positive: Flank pain Negative: CP, SOB, numbness, weakness  Physical Exam  BP (!) 127/97 (BP Location: Right Arm)   Pulse 81   Temp 97.6 F (36.4 C)   Resp (!) 22   SpO2 100%  Gen:   Awake, no distress   Resp:  Normal effort  MSK:   Moves extremities without difficulty, tenderness to left gluteal region, SI ABD:  Diffuse tenderness to left flank Other:    Medical Decision Making  Medically screening exam initiated at 1:38 PM.  Appropriate orders placed.  Shaeleigh Graw was informed that the remainder of the evaluation will be completed by another provider, this initial triage assessment does not replace that evaluation, and the importance of remaining in the ED until their evaluation is complete.  Flank pain, low back pain   Eve Rey A, PA-C 12/27/20 1340    Terald Sleeper, MD 12/27/20 2259

## 2020-12-27 NOTE — ED Notes (Signed)
ED Provider at bedside. 

## 2020-12-27 NOTE — ED Notes (Signed)
Pt NAD, a/ox4. Pt verbalizes relief with pain medicine and is now able to slowly ambulate. IV DC intact. Pt verbalizes understanding of all DC and f/u instructions. All questions answered. Pt wheeled to lobby where husband waiting for her

## 2020-12-27 NOTE — ED Triage Notes (Addendum)
C/o L flank pain x 40 min.  Denies nausea, vomiting, and urinary complaints.  PT appears to be in extreme pain.  Diaphoretic.

## 2020-12-27 NOTE — ED Notes (Signed)
Patient transported to CT 

## 2020-12-27 NOTE — ED Notes (Signed)
Pt able to ambulate slowly. MD made aware

## 2020-12-27 NOTE — ED Notes (Addendum)
Pt return from ct

## 2020-12-27 NOTE — ED Notes (Signed)
Pt unable to ambulate, groaning loudly with facial grimmacing. MD made aware

## 2021-02-10 DIAGNOSIS — M4722 Other spondylosis with radiculopathy, cervical region: Secondary | ICD-10-CM | POA: Insufficient documentation

## 2021-10-25 ENCOUNTER — Emergency Department (HOSPITAL_COMMUNITY)
Admission: EM | Admit: 2021-10-25 | Discharge: 2021-10-25 | Disposition: A | Discharge status: Home / Self Care | Payer: Commercial Managed Care - PPO | Attending: Emergency Medicine | Admitting: Emergency Medicine

## 2021-10-25 ENCOUNTER — Other Ambulatory Visit: Payer: Self-pay

## 2021-10-25 ENCOUNTER — Encounter (HOSPITAL_COMMUNITY): Payer: Self-pay | Admitting: Emergency Medicine

## 2021-10-25 ENCOUNTER — Emergency Department (HOSPITAL_COMMUNITY): Payer: Commercial Managed Care - PPO

## 2021-10-25 DIAGNOSIS — M47812 Spondylosis without myelopathy or radiculopathy, cervical region: Secondary | ICD-10-CM

## 2021-10-25 DIAGNOSIS — M25512 Pain in left shoulder: Secondary | ICD-10-CM

## 2021-10-25 DIAGNOSIS — M4722 Other spondylosis with radiculopathy, cervical region: Secondary | ICD-10-CM | POA: Insufficient documentation

## 2021-10-25 DIAGNOSIS — M546 Pain in thoracic spine: Secondary | ICD-10-CM | POA: Diagnosis present

## 2021-10-25 DIAGNOSIS — M5412 Radiculopathy, cervical region: Secondary | ICD-10-CM

## 2021-10-25 LAB — URINALYSIS, ROUTINE W REFLEX MICROSCOPIC
Bilirubin Urine: NEGATIVE
Glucose, UA: NEGATIVE mg/dL
Hgb urine dipstick: NEGATIVE
Ketones, ur: NEGATIVE mg/dL
Leukocytes,Ua: NEGATIVE
Nitrite: NEGATIVE
Protein, ur: NEGATIVE mg/dL
Specific Gravity, Urine: 1.004 — ABNORMAL LOW (ref 1.005–1.030)
pH: 5 (ref 5.0–8.0)

## 2021-10-25 LAB — CBC WITH DIFFERENTIAL/PLATELET
Abs Immature Granulocytes: 0.04 10*3/uL (ref 0.00–0.07)
Basophils Absolute: 0 10*3/uL (ref 0.0–0.1)
Basophils Relative: 0 %
Eosinophils Absolute: 0.1 10*3/uL (ref 0.0–0.5)
Eosinophils Relative: 1 %
HCT: 42.7 % (ref 36.0–46.0)
Hemoglobin: 13.2 g/dL (ref 12.0–15.0)
Immature Granulocytes: 0 %
Lymphocytes Relative: 28 %
Lymphs Abs: 2.6 10*3/uL (ref 0.7–4.0)
MCH: 27.6 pg (ref 26.0–34.0)
MCHC: 30.9 g/dL (ref 30.0–36.0)
MCV: 89.3 fL (ref 80.0–100.0)
Monocytes Absolute: 0.5 10*3/uL (ref 0.1–1.0)
Monocytes Relative: 5 %
Neutro Abs: 6 10*3/uL (ref 1.7–7.7)
Neutrophils Relative %: 66 %
Platelets: 301 10*3/uL (ref 150–400)
RBC: 4.78 MIL/uL (ref 3.87–5.11)
RDW: 14.1 % (ref 11.5–15.5)
WBC: 9.2 10*3/uL (ref 4.0–10.5)
nRBC: 0 % (ref 0.0–0.2)

## 2021-10-25 LAB — COMPREHENSIVE METABOLIC PANEL
ALT: 31 U/L (ref 0–44)
AST: 21 U/L (ref 15–41)
Albumin: 3.9 g/dL (ref 3.5–5.0)
Alkaline Phosphatase: 79 U/L (ref 38–126)
Anion gap: 8 (ref 5–15)
BUN: 14 mg/dL (ref 6–20)
CO2: 27 mmol/L (ref 22–32)
Calcium: 9.4 mg/dL (ref 8.9–10.3)
Chloride: 102 mmol/L (ref 98–111)
Creatinine, Ser: 0.7 mg/dL (ref 0.44–1.00)
GFR, Estimated: >60 mL/min (ref >60)
Glucose, Bld: 104 mg/dL — ABNORMAL HIGH (ref 70–99)
Potassium: 3.8 mmol/L (ref 3.5–5.1)
Sodium: 137 mmol/L (ref 135–145)
Total Bilirubin: 0.5 mg/dL (ref 0.3–1.2)
Total Protein: 7.6 g/dL (ref 6.5–8.1)

## 2021-10-25 LAB — TROPONIN I (HIGH SENSITIVITY): Troponin I (High Sensitivity): 5 ng/L (ref <18)

## 2021-10-25 MED ORDER — HYDROCODONE-ACETAMINOPHEN 5-325 MG PO TABS
2.0000 | ORAL_TABLET | Freq: Once | ORAL | Status: AC
  Administered 2021-10-25: 2 via ORAL
  Filled 2021-10-25: qty 2

## 2021-10-25 MED ORDER — PREDNISONE 10 MG PO TABS: ORAL_TABLET | ORAL | 0 refills | Status: AC

## 2021-10-25 MED ORDER — PREDNISONE 20 MG PO TABS
60.0000 mg | ORAL_TABLET | Freq: Once | ORAL | Status: AC
  Administered 2021-10-25: 60 mg via ORAL
  Filled 2021-10-25: qty 3

## 2021-10-25 MED ORDER — OXYCODONE-ACETAMINOPHEN 5-325 MG PO TABS: 1.0000 | ORAL_TABLET | Freq: Four times a day (QID) | ORAL | 0 refills | Status: DC | PRN

## 2021-10-25 NOTE — Discharge Instructions (Signed)
The pain which is in the area of your left scapula, is most likely radiating pain from the lower cervical spine where the nerve is irritated.  We are treating you with 2 medicines, to help both inflammation and pain.  Do not drive, or work, when taking the narcotic pain reliever, oxycodone.  Try using heat on the sore area 3-4 times a day.  Call your doctor at [facility] who previously worked with you on your neck pain.  They may be able to help you further.  We sent prescriptions to take to help your pain.

## 2021-10-25 NOTE — ED Provider Triage Note (Signed)
Emergency Medicine Provider Triage Evaluation Note  Abryana Lykens , a 56 y.o. female  was evaluated in triage.  Pt complains of mid upper back pain that wraps around and goes into the middle of her chest constantly for the past 10 days. Reports some occasional SOB. Denies any cough or cold symptoms. Denies any history of a blood clot. Denies any medical history. Denies any trauma to the area or any heavy lifting. .  Review of Systems  Positive:  Negative:   Physical Exam  BP 129/82   Pulse 82   Temp 98.1 F (36.7 C)   Resp 16   SpO2 100%  Gen:   Awake, no distress   Resp:  Normal effort  MSK:   Moves extremities without difficulty  Other:  RRR , CTAB  Medical Decision Making  Medically screening exam initiated at 6:45 PM.  Appropriate orders placed.  Consuello Lassalle was informed that the remainder of the evaluation will be completed by another provider, this initial triage assessment does not replace that evaluation, and the importance of remaining in the ED until their evaluation is complete.  Cardiac labs ordered   Achille Rich, Cordelia Poche 10/25/21 3888

## 2021-10-25 NOTE — ED Notes (Signed)
Lab called to report serum osmolality of 321.

## 2021-10-25 NOTE — ED Notes (Signed)
DC instructions reviewed with pt. Pt verbalized understanding.  Pt DC 

## 2021-10-25 NOTE — ED Provider Notes (Signed)
[facility] EMERGENCY DEPARTMENT Provider Note   CSN: [id number] Arrival date & time: 10/25/21  1805     History {Add pertinent medical, surgical, social history, OB history to HPI:1} Chief Complaint  Patient presents with   Back Pain    Savannah Warren is a 56 y.o. female.  HPI 9:33 PM.  Patient now in the room after being in the waiting room.  Patient presenting for evaluation of upper back pain that spreads from that location.  She locates the pain at the left subscapular region.  The pain is constant and sometimes worse with deep breathing but not with movement of the neck, arms or back.  No fever, chills, cough, shortness of breath, nausea or vomiting.  There are no other known modifying factors.     Home Medications Prior to Admission medications   Medication Sig Start Date End Date Taking? Authorizing Provider  acetaminophen (TYLENOL) 325 MG tablet Take 2 tablets (650 mg total) by mouth every 6 (six) hours as needed for up to 30 doses for mild pain or moderate pain. 12/27/20   Terald Sleeper, MD  docusate sodium (COLACE) 100 MG capsule Take 1 capsule (100 mg total) by mouth daily as needed for mild constipation. 12/27/20   Terald Sleeper, MD  HYDROcodone-acetaminophen (NORCO/VICODIN) 5-325 MG tablet Take 1 tablet by mouth every 6 (six) hours as needed for moderate pain or severe pain. 03/30/18   Fayrene Helper, PA-C  meloxicam (MOBIC) 7.5 MG tablet Take 7.5 mg by mouth 2 (two) times daily. 03/28/18   [provider]  oxyCODONE (ROXICODONE) 5 MG immediate release tablet Take 1 tablet (5 mg total) by mouth every 4 (four) hours as needed for up to 20 doses for severe pain. 12/27/20   Terald Sleeper, MD      Allergies    Patient has no known allergies.    Review of Systems   Review of Systems  Physical Exam Updated Vital Signs BP 129/82   Pulse 82   Temp 98.1 F (36.7 C)   Resp 16   SpO2 100%  Physical Exam Vitals and nursing note  reviewed.  Constitutional:      General: She is not in acute distress.    Appearance: She is well-developed. She is not ill-appearing or diaphoretic.  HENT:     Head: Normocephalic and atraumatic.     Right Ear: External ear normal.     Left Ear: External ear normal.  Eyes:     Conjunctiva/sclera: Conjunctivae normal.     Pupils: Pupils are equal, round, and reactive to light.  Neck:     Trachea: Phonation normal.  Cardiovascular:     Rate and Rhythm: Normal rate.  Pulmonary:     Effort: Pulmonary effort is normal.  Abdominal:     General: There is no distension.     Palpations: Abdomen is soft.  Musculoskeletal:        General: No swelling or tenderness. Normal range of motion.     Cervical back: Normal range of motion and neck supple.     Comments: Normal range of motion neck, arms and legs.  Skin:    General: Skin is warm and dry.  Neurological:     Mental Status: She is alert and oriented to person, place, and time.     Cranial Nerves: No cranial nerve deficit.     Sensory: No sensory deficit.     Motor: No abnormal muscle tone.  Coordination: Coordination normal.  Psychiatric:        Mood and Affect: Mood normal.        Behavior: Behavior normal.        Thought Content: Thought content normal.        Judgment: Judgment normal.    ED Results / Procedures / Treatments   Labs (all labs ordered are listed, but only abnormal results are displayed) Labs Reviewed  COMPREHENSIVE METABOLIC PANEL - Abnormal; Notable for the following components:      Result Value   Glucose, Bld 104 (*)    All other components within normal limits  URINALYSIS, ROUTINE W REFLEX MICROSCOPIC - Abnormal; Notable for the following components:   Color, Urine STRAW (*)    Specific Gravity, Urine 1.004 (*)    All other components within normal limits  CBC WITH DIFFERENTIAL/PLATELET  TROPONIN I (HIGH SENSITIVITY)  TROPONIN I (HIGH SENSITIVITY)    EKG EKG  Interpretation  Date/Time:  Monday Oct 25 2021 19:04:11 EDT Ventricular Rate:  80 PR Interval:  172 QRS Duration: 92 QT Interval:  370 QTC Calculation: 426 R Axis:   29 Text Interpretation: Normal sinus rhythm Incomplete right bundle branch block Borderline ECG When compared with ECG of 27-Dec-2020 13:41, PREVIOUS ECG IS PRESENT Since last tracing mild intraventricular conduction delay has occurred Otherwise no significant change Confirmed by Mancel Bale [phone]) on 10/25/2021 8:57:56 PM  Radiology DG Chest 2 View  Result Date: 10/25/2021 CLINICAL DATA:  Chest pain EXAM: CHEST - 2 VIEW COMPARISON:  12/05/2018 FINDINGS: Heart and mediastinal contours are within normal limits. No focal opacities or effusions. No acute bony abnormality. IMPRESSION: No active cardiopulmonary disease. Electronically Signed   By: Charlett Nose M.D.   On: 10/25/2021 19:22    Procedures Procedures  {Document cardiac monitor, telemetry assessment procedure when appropriate:1}  Medications Ordered in ED Medications - No data to display  ED Course/ Medical Decision Making/ A&P Clinical Course as of 10/25/21 2110  Mon Oct 25, 2021  2106 Not in room when I arrived to see her  [EW]    Clinical Course User Index [EW] Mancel Bale, MD                           Medical Decision Making Patient presenting with left subscapular pain, likely cervical radiculopathy based on prior history and clinical exam.  No suspicion for intrathoracic or thoracic spine disorders.  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  Amount and/or Complexity of Data Reviewed External Data Reviewed: radiology.    Details: MRI cervical spine, MRI lumbar spine, August 2023.   ***  {Document critical care time when appropriate:1} {Document review of labs and clinical decision tools ie heart score, Chads2Vasc2 etc:1}  {Document your independent  review of radiology images, and any outside records:1} {Document your discussion with family members, caretakers, and with consultants:1} {Document social determinants of health affecting pt's care:1} {Document your decision making why or why not admission, treatments were needed:1} Final Clinical Impression(s) / ED Diagnoses Final diagnoses:  None    Rx / DC Orders ED Discharge Orders     None

## 2021-10-25 NOTE — ED Triage Notes (Signed)
Patient coming from home, complaint of back pain that radiated to left flank and to chest for approx. 10 days. States that the pain does not get any worse but also has not gotten any better.

## 2021-11-21 IMAGING — CT CT L SPINE W/O CM
3 series · 12 of 33 positions shown, 14 images · IV contrast (APPLIED)
Comparison: CT abdomen and pelvis 12/05/2019.

CLINICAL DATA: Acute onset left flank pain today.  No known injury.

EXAM:
CT Lumbar Spine with contrast
TECHNIQUE: 
TECHNIQUE: Multiplanar CT images of the lumbar spine were
reconstructed from contemporary CT of the Abdomen and Pelvis.
CONTRAST:  No additional.

[Series 3: lspine axial soft tissue · axial · 0.34mm/px · z∈[-393,-235]mm · 4 of 115 slices shown, 5 images]
[im 18/115  soft-tissue]
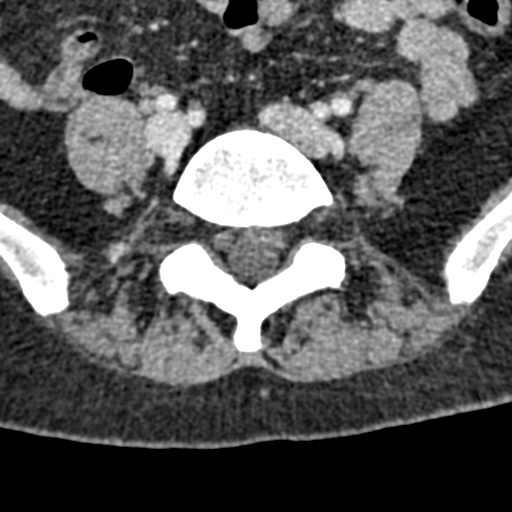
[im 18/115  bone]
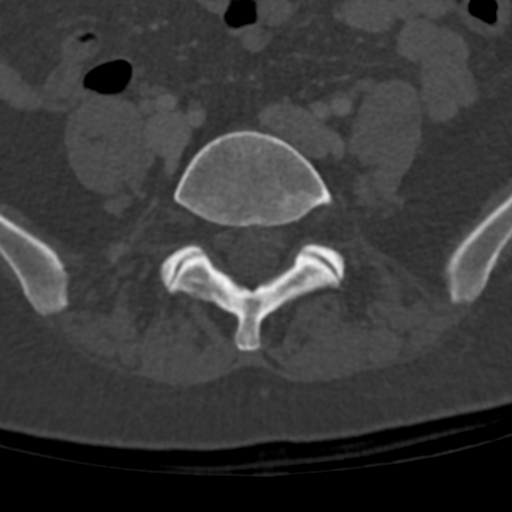
[im 44/115  bone]
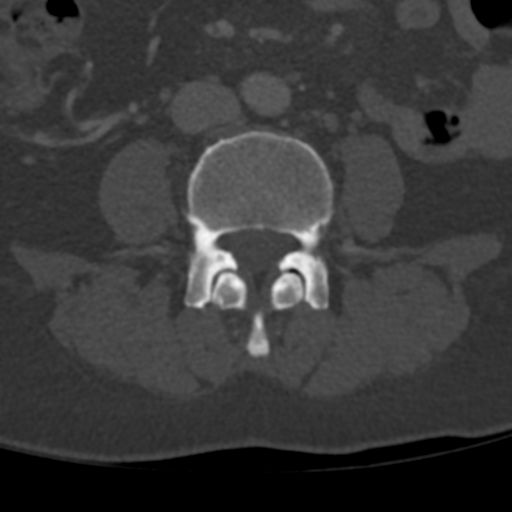
[im 71/115  bone]
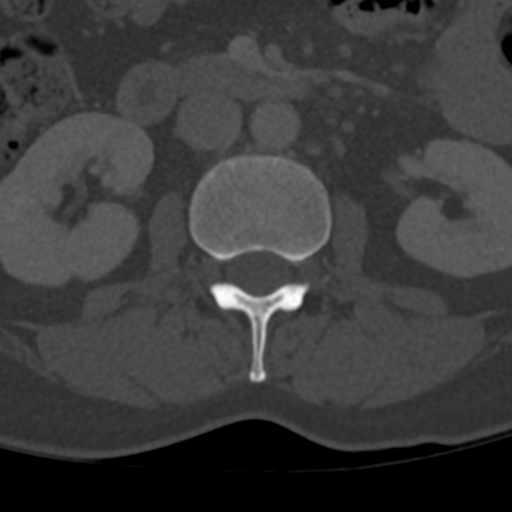
[im 97/115  bone]
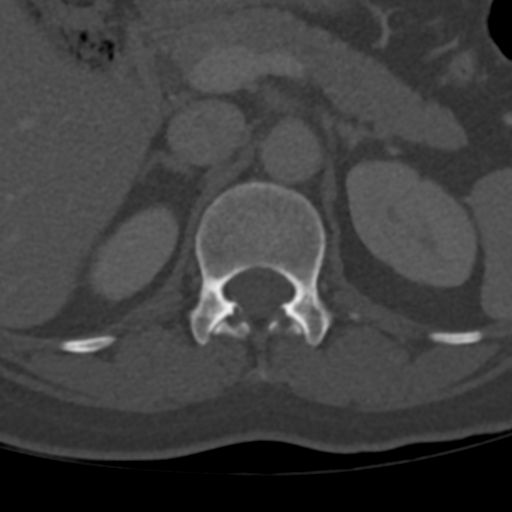

[Series 5: sag lspine bone sagittal · sagittal · 0.28mm/px · 5 of 83 slices shown, 6 images]
[im 28/83  bone]
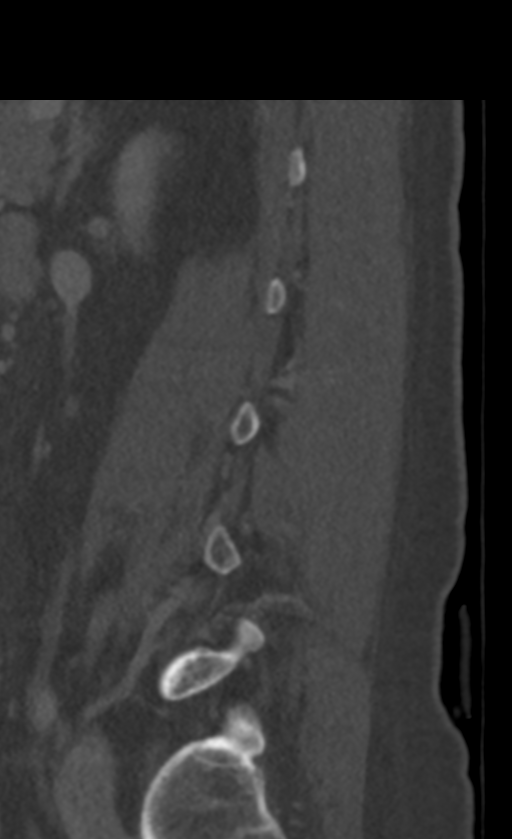
[im 35/83  bone]
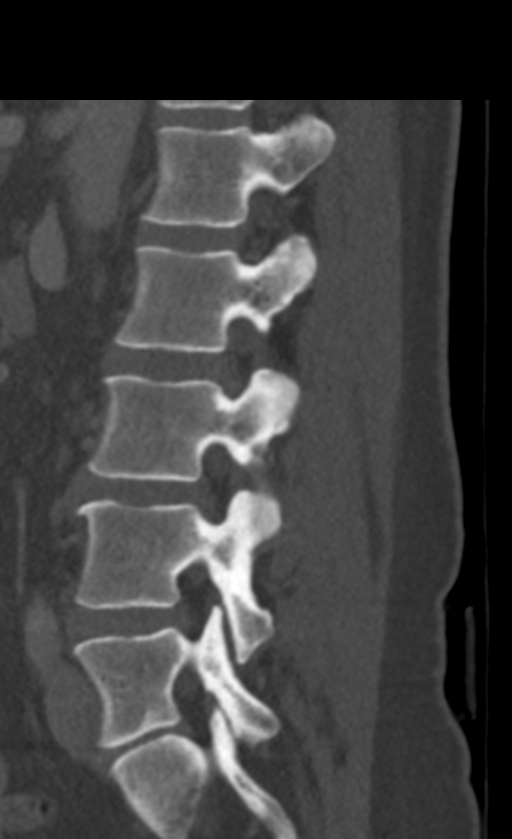
[im 42/83  soft-tissue]
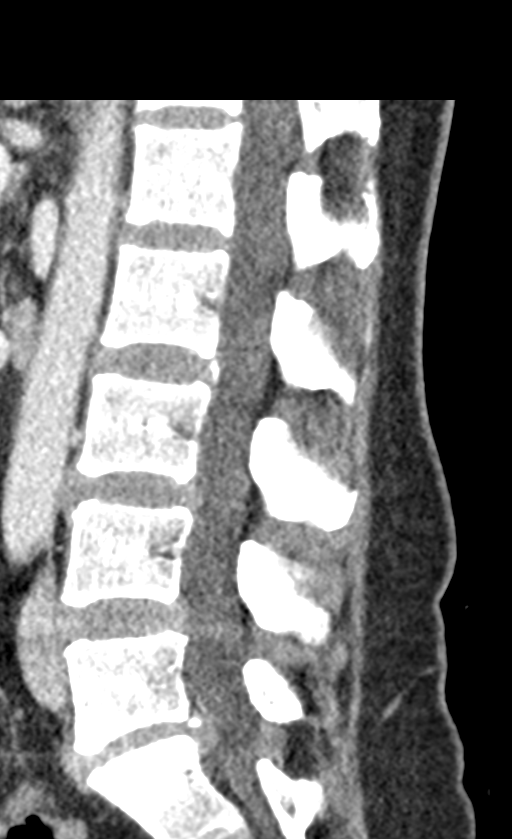
[im 42/83  bone]
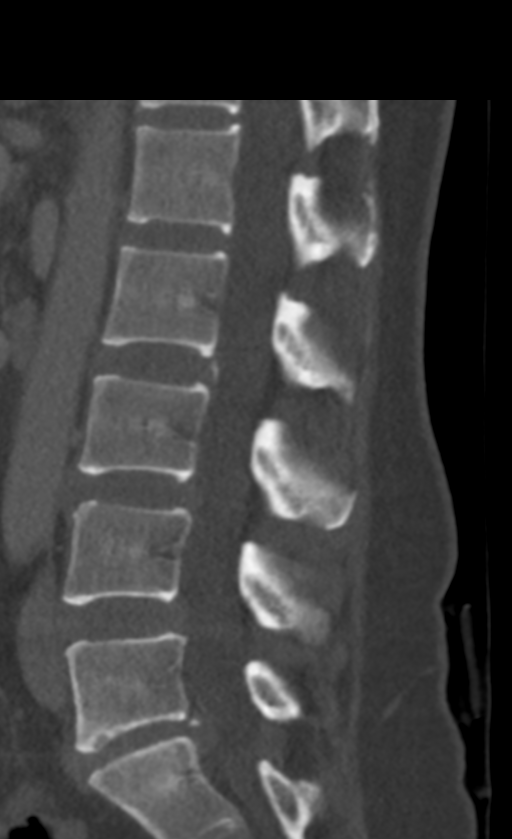
[im 48/83  bone]
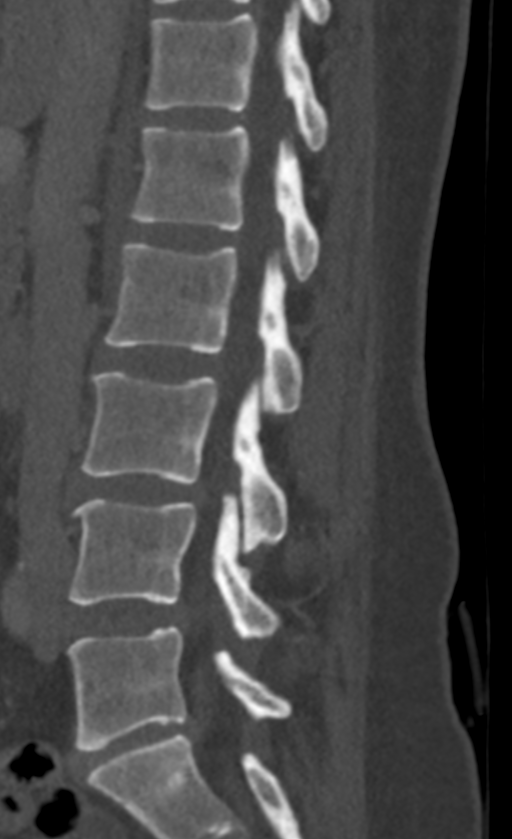
[im 55/83  bone]
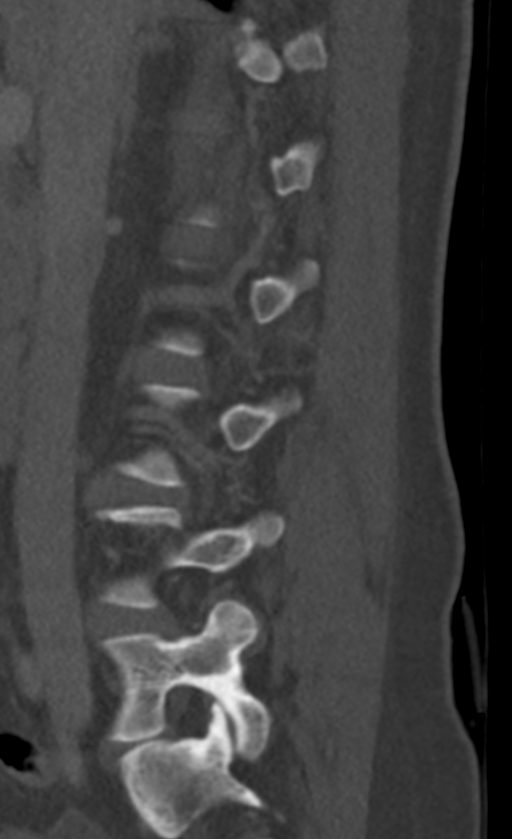

[Series 6: lspine bone coronal · coronal · 0.33mm/px · 3 of 74 slices shown]
[im 15/74  bone]
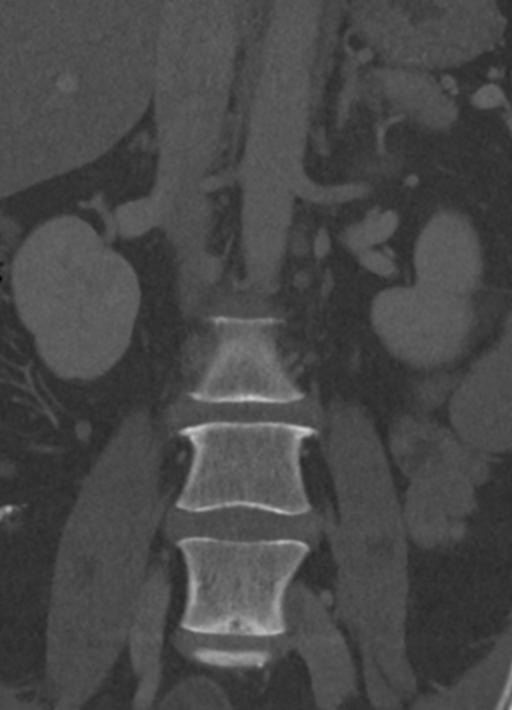
[im 30/74  bone]
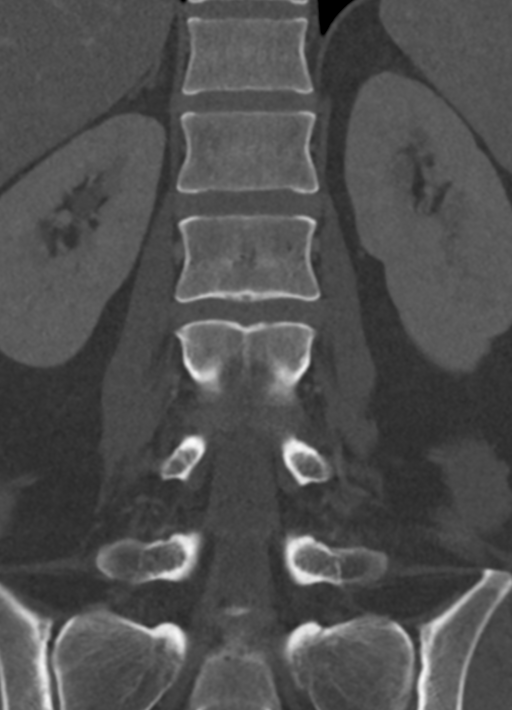
[im 44/74  bone]
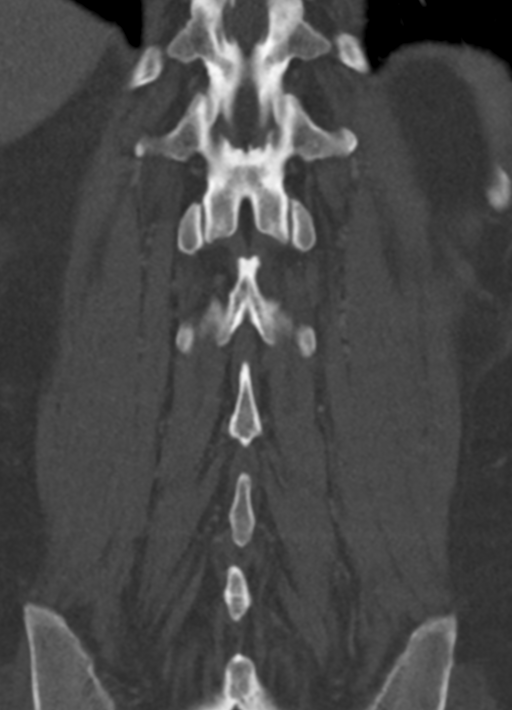

[12 of 33 positions shown; findings below may reference images not displayed]

FINDINGS: Segmentation: Standard.

Alignment: Normal.

Vertebrae: No fracture or focal lesion.

Paraspinal and other soft tissues: See report of dedicated abdomen
and pelvis CT today.

Disc levels: Mild degenerative disc disease appears unchanged.
Shallow disc bulges are present at L2-3 and L3-4. There is a shallow
central protrusion at L5-S1. The central canal and foramina appear
open at all levels.
IMPRESSION: No acute abnormality.

Mild degenerative change. The central canal and foramina appear open
at all levels.

## 2022-01-15 ENCOUNTER — Encounter (HOSPITAL_COMMUNITY): Payer: Self-pay | Admitting: Emergency Medicine

## 2022-01-15 ENCOUNTER — Emergency Department (HOSPITAL_COMMUNITY): Payer: Self-pay

## 2022-01-15 ENCOUNTER — Other Ambulatory Visit: Payer: Self-pay

## 2022-01-15 ENCOUNTER — Emergency Department (HOSPITAL_COMMUNITY)
Admission: EM | Admit: 2022-01-15 | Discharge: 2022-01-15 | Disposition: A | Discharge status: Home / Self Care | Payer: Self-pay | Attending: Emergency Medicine | Admitting: Emergency Medicine

## 2022-01-15 DIAGNOSIS — F32A Depression, unspecified: Secondary | ICD-10-CM

## 2022-01-15 DIAGNOSIS — F419 Anxiety disorder, unspecified: Secondary | ICD-10-CM

## 2022-01-15 DIAGNOSIS — R519 Headache, unspecified: Secondary | ICD-10-CM | POA: Insufficient documentation

## 2022-01-15 DIAGNOSIS — F418 Other specified anxiety disorders: Secondary | ICD-10-CM | POA: Insufficient documentation

## 2022-01-15 DIAGNOSIS — E860 Dehydration: Secondary | ICD-10-CM | POA: Insufficient documentation

## 2022-01-15 LAB — URINALYSIS, MICROSCOPIC (REFLEX): RBC / HPF: NONE SEEN RBC/hpf (ref 0–5)

## 2022-01-15 LAB — RAPID URINE DRUG SCREEN, HOSP PERFORMED
Amphetamines: NOT DETECTED
Barbiturates: NOT DETECTED
Benzodiazepines: NOT DETECTED
Cocaine: NOT DETECTED
Opiates: NOT DETECTED
Tetrahydrocannabinol: NOT DETECTED

## 2022-01-15 LAB — BASIC METABOLIC PANEL
Anion gap: 11 (ref 5–15)
BUN: 9 mg/dL (ref 6–20)
CO2: 26 mmol/L (ref 22–32)
Calcium: 9.5 mg/dL (ref 8.9–10.3)
Chloride: 104 mmol/L (ref 98–111)
Creatinine, Ser: 0.74 mg/dL (ref 0.44–1.00)
GFR, Estimated: >60 mL/min (ref >60)
Glucose, Bld: 102 mg/dL — ABNORMAL HIGH (ref 70–99)
Potassium: 3.7 mmol/L (ref 3.5–5.1)
Sodium: 141 mmol/L (ref 135–145)

## 2022-01-15 LAB — URINALYSIS, ROUTINE W REFLEX MICROSCOPIC
Bilirubin Urine: NEGATIVE
Glucose, UA: NEGATIVE mg/dL
Hgb urine dipstick: NEGATIVE
Ketones, ur: NEGATIVE mg/dL
Nitrite: NEGATIVE
Protein, ur: NEGATIVE mg/dL
Specific Gravity, Urine: >1.03 — ABNORMAL HIGH (ref 1.005–1.030)
pH: 6 (ref 5.0–8.0)

## 2022-01-15 LAB — CBC
HCT: 42.6 % (ref 36.0–46.0)
Hemoglobin: 13.8 g/dL (ref 12.0–15.0)
MCH: 28.7 pg (ref 26.0–34.0)
MCHC: 32.4 g/dL (ref 30.0–36.0)
MCV: 88.6 fL (ref 80.0–100.0)
Platelets: 329 10*3/uL (ref 150–400)
RBC: 4.81 MIL/uL (ref 3.87–5.11)
RDW: 13.7 % (ref 11.5–15.5)
WBC: 8.6 10*3/uL (ref 4.0–10.5)
nRBC: 0 % (ref 0.0–0.2)

## 2022-01-15 LAB — ETHANOL: Alcohol, Ethyl (B): <10 mg/dL (ref <10)

## 2022-01-15 LAB — I-STAT BETA HCG BLOOD, ED (MC, WL, AP ONLY): I-stat hCG, quantitative: <5 m[IU]/mL (ref <5)

## 2022-01-15 MED ORDER — LORAZEPAM 1 MG PO TABS: 1.0000 mg | ORAL_TABLET | Freq: Two times a day (BID) | ORAL | 0 refills | Status: AC | PRN

## 2022-01-15 MED ORDER — SODIUM CHLORIDE 0.9 % IV BOLUS
1000.0000 mL | Freq: Once | INTRAVENOUS | Status: AC
  Administered 2022-01-15: 1000 mL via INTRAVENOUS

## 2022-01-15 MED ORDER — LORAZEPAM 2 MG/ML IJ SOLN
1.0000 mg | Freq: Once | INTRAMUSCULAR | Status: AC
  Administered 2022-01-15: 1 mg via INTRAVENOUS
  Filled 2022-01-15: qty 1

## 2022-01-15 NOTE — ED Provider Notes (Signed)
[facility] EMERGENCY DEPARTMENT Provider Note   CSN: [id number] Arrival date & time: 01/15/22  1544     History  Chief Complaint  Patient presents with   Loss of Consciousness    Savannah Warren is a 56 y.o. female.  Pt is a 56 yo female with a pmhx significant for neck problems.  She said she has been feeling very anxious.  She has been tearful.  She has not wanted to eat.  She has not been able to sleep for 2 nights.  She has had a headache and passed out twice.  It is difficult to get a story out of her as she can't stop crying.  She denies SI.  She said she wants some medicine to make her feel better.  She said she's been going through a stressful situation at home, but does not want to share what it is.  Due to language barrier, an interpreter was present during the history-taking and subsequent discussion (and for part of the physical exam) with this patient.   Pt offered the video interpreter, but preferred to use her husband.       Home Medications Prior to Admission medications   Medication Sig Start Date End Date Taking? Authorizing Provider  LORazepam (ATIVAN) 1 MG tablet Take 1 tablet (1 mg total) by mouth 2 (two) times daily as needed for anxiety. 01/15/22  Yes Jacalyn Lefevre, MD  acetaminophen (TYLENOL) 325 MG tablet Take 2 tablets (650 mg total) by mouth every 6 (six) hours as needed for up to 30 doses for mild pain or moderate pain. 12/27/20   Terald Sleeper, MD  docusate sodium (COLACE) 100 MG capsule Take 1 capsule (100 mg total) by mouth daily as needed for mild constipation. 12/27/20   Terald Sleeper, MD  HYDROcodone-acetaminophen (NORCO/VICODIN) 5-325 MG tablet Take 1 tablet by mouth every 6 (six) hours as needed for moderate pain or severe pain. 03/30/18   Fayrene Helper, PA-C  meloxicam (MOBIC) 7.5 MG tablet Take 7.5 mg by mouth 2 (two) times daily. 03/28/18   [provider]  oxyCODONE (ROXICODONE) 5 MG immediate release tablet  Take 1 tablet (5 mg total) by mouth every 4 (four) hours as needed for up to 20 doses for severe pain. 12/27/20   Terald Sleeper, MD  oxyCODONE-acetaminophen (PERCOCET/ROXICET) 5-325 MG tablet Take 1 tablet by mouth every 6 (six) hours as needed for severe pain. 10/25/21   Mancel Bale, MD  predniSONE (DELTASONE) 10 MG tablet Take q day 6,5,4,3,2,1 10/25/21   Mancel Bale, MD      Allergies    Patient has no known allergies.    Review of Systems   Review of Systems  Neurological:  Positive for headaches.  Psychiatric/Behavioral:  Positive for dysphoric mood and sleep disturbance. The patient is nervous/anxious.   All other systems reviewed and are negative.   Physical Exam Updated Vital Signs BP (!) 153/96   Pulse 88   Temp 98.8 F (37.1 C) (Oral)   Resp 14   SpO2 100%  Physical Exam Vitals and nursing note reviewed.  Constitutional:      Appearance: Normal appearance.  HENT:     Head: Normocephalic and atraumatic.     Right Ear: External ear normal.     Left Ear: External ear normal.     Nose: Nose normal.     Mouth/Throat:     Mouth: Mucous membranes are moist.     Pharynx: Oropharynx is clear.  Eyes:     Extraocular Movements: Extraocular movements intact.     Conjunctiva/sclera: Conjunctivae normal.     Pupils: Pupils are equal, round, and reactive to light.  Cardiovascular:     Rate and Rhythm: Normal rate and regular rhythm.     Pulses: Normal pulses.     Heart sounds: Normal heart sounds.  Pulmonary:     Effort: Pulmonary effort is normal.     Breath sounds: Normal breath sounds.  Abdominal:     General: Abdomen is flat. Bowel sounds are normal.     Palpations: Abdomen is soft.  Musculoskeletal:        General: Normal range of motion.     Cervical back: Normal range of motion and neck supple.  Skin:    General: Skin is warm.     Capillary Refill: Capillary refill takes less than 2 seconds.  Neurological:     General: No focal deficit present.      Mental Status: She is alert and oriented to person, place, and time.  Psychiatric:        Mood and Affect: Mood is depressed. Affect is tearful.     ED Results / Procedures / Treatments   Labs (all labs ordered are listed, but only abnormal results are displayed) Labs Reviewed  BASIC METABOLIC PANEL - Abnormal; Notable for the following components:      Result Value   Glucose, Bld 102 (*)    All other components within normal limits  URINALYSIS, ROUTINE W REFLEX MICROSCOPIC - Abnormal; Notable for the following components:   Specific Gravity, Urine >1.030 (*)    Leukocytes,Ua TRACE (*)    All other components within normal limits  URINALYSIS, MICROSCOPIC (REFLEX) - Abnormal; Notable for the following components:   Bacteria, UA RARE (*)    All other components within normal limits  RESP PANEL BY RT-PCR (FLU A&B, COVID) ARPGX2  CBC  ETHANOL  RAPID URINE DRUG SCREEN, HOSP PERFORMED  I-STAT BETA HCG BLOOD, ED ([facility], [facility], [facility] ONLY)  CBG MONITORING, ED    EKG EKG Interpretation  Date/Time:  Saturday January 15 2022 16:00:16 EDT Ventricular Rate:  88 PR Interval:  142 QRS Duration: 90 QT Interval:  342 QTC Calculation: 413 R Axis:   65 Text Interpretation: Normal sinus rhythm with sinus arrhythmia Normal ECG When compared with ECG of 25-Oct-2021 19:04, PREVIOUS ECG IS PRESENT No significant change since last tracing Confirmed by Jacalyn Lefevre [phone]) on 01/15/2022 6:52:45 PM  Radiology CT Head Wo Contrast  Result Date: 01/15/2022 CLINICAL DATA:  Headache, new or worsening (Age >= 50y) EXAM: CT HEAD WITHOUT CONTRAST TECHNIQUE: Contiguous axial images were obtained from the base of the skull through the vertex without intravenous contrast. RADIATION DOSE REDUCTION: This exam was performed according to the departmental dose-optimization program which includes automated exposure control, adjustment of the mA and/or kV according to patient size and/or use of iterative reconstruction  technique. COMPARISON:  07/29/2017 FINDINGS: Brain: No acute intracranial abnormality. Specifically, no hemorrhage, hydrocephalus, mass lesion, acute infarction, or significant intracranial injury. Vascular: No hyperdense vessel or unexpected calcification. Skull: No acute calvarial abnormality. Sinuses/Orbits: No acute findings Other: None IMPRESSION: Normal study Electronically Signed   By: Charlett Nose M.D.   On: 01/15/2022 19:25    Procedures Procedures    Medications Ordered in ED Medications  sodium chloride 0.9 % bolus 1,000 mL (1,000 mLs Intravenous New Bag/Given 01/15/22 1943)  LORazepam (ATIVAN) injection 1 mg (1 mg Intravenous Given 01/15/22 1942)  ED Course/ Medical Decision Making/ A&P                           Medical Decision Making Amount and/or Complexity of Data Reviewed Labs: ordered. Radiology: ordered.  Risk Prescription drug management.   This patient presents to the ED for concern of depression/anxiety, this involves an extensive number of treatment options, and is a complaint that carries with it a high risk of complications and morbidity.  The differential diagnosis includes electrolyte abn, anemia, cardiac problem, psych   Co morbidities that complicate the patient evaluation  none   Additional history obtained:  Additional history obtained from epic chart review External records from outside source obtained and reviewed including husband   Lab Tests:  I Ordered, and personally interpreted labs.  The pertinent results include:  cbc nl, bmp nl, ua nl   Imaging Studies ordered:  I ordered imaging studies including ct head  I independently visualized and interpreted imaging which showed  IMPRESSION:  Normal study   I agree with the radiologist interpretation   Cardiac Monitoring:  The patient was maintained on a cardiac monitor.  I personally viewed and interpreted the cardiac monitored which showed an underlying rhythm of:  nsr   Medicines ordered and prescription drug management:  I ordered medication including ativan and ivfs  for anxiety  Reevaluation of the patient after these medicines showed that the patient improved I have reviewed the patients home medicines and have made adjustments as needed  Problem List / ED Course:  Anxiety and Depression:  pt needs to see a counselor. Pt is not suicidal and does not want to see TTS tonight.  She will be referred to [facility].  Pt is stable for d/c.  Return if worse.    Reevaluation:  After the interventions noted above, I reevaluated the patient and found that they have :improved   Social Determinants of Health:  Spanish speaker; No insurance   Dispostion:  After consideration of the diagnostic results and the patients response to treatment, I feel that the patent would benefit from discharge with outpatient f/u.          Final Clinical Impression(s) / ED Diagnoses Final diagnoses:  Dehydration  Anxiety  Depression, unspecified depression type    Rx / DC Orders ED Discharge Orders          Ordered    LORazepam (ATIVAN) 1 MG tablet  2 times daily PRN        01/15/22 2128              Jacalyn Lefevre, MD 01/15/22 2128

## 2022-01-15 NOTE — ED Notes (Signed)
Patient verbalizes understanding of discharge instructions. Opportunity for questioning and answers were provided. Armband removed by staff, pt discharged from ED via wheelchair.  

## 2022-01-15 NOTE — ED Notes (Signed)
Lab to add on urine drug screen  

## 2022-01-15 NOTE — ED Triage Notes (Signed)
Patient here with complaint of syncope yesterday while outside in her yard and a headache that has been ongoing since then. Patient is alert, oriented, ambulatory, and in no apparent distress at this time. Patient denies chest pain.

## 2022-02-08 ENCOUNTER — Emergency Department (HOSPITAL_COMMUNITY): Payer: Self-pay

## 2022-02-08 ENCOUNTER — Emergency Department (HOSPITAL_COMMUNITY)
Admission: EM | Admit: 2022-02-08 | Discharge: 2022-02-09 | Disposition: A | Discharge status: Home / Self Care | Payer: Self-pay | Attending: Emergency Medicine | Admitting: Emergency Medicine

## 2022-02-08 ENCOUNTER — Encounter (HOSPITAL_COMMUNITY): Payer: Self-pay | Admitting: Emergency Medicine

## 2022-02-08 ENCOUNTER — Other Ambulatory Visit: Payer: Self-pay

## 2022-02-08 DIAGNOSIS — N3001 Acute cystitis with hematuria: Secondary | ICD-10-CM | POA: Insufficient documentation

## 2022-02-08 LAB — CBC WITH DIFFERENTIAL/PLATELET
Abs Immature Granulocytes: 0.03 10*3/uL (ref 0.00–0.07)
Basophils Absolute: 0 10*3/uL (ref 0.0–0.1)
Basophils Relative: 0 %
Eosinophils Absolute: 0 10*3/uL (ref 0.0–0.5)
Eosinophils Relative: 0 %
HCT: 39.9 % (ref 36.0–46.0)
Hemoglobin: 13.2 g/dL (ref 12.0–15.0)
Immature Granulocytes: 0 %
Lymphocytes Relative: 14 %
Lymphs Abs: 1.7 10*3/uL (ref 0.7–4.0)
MCH: 28.4 pg (ref 26.0–34.0)
MCHC: 33.1 g/dL (ref 30.0–36.0)
MCV: 85.8 fL (ref 80.0–100.0)
Monocytes Absolute: 0.8 10*3/uL (ref 0.1–1.0)
Monocytes Relative: 6 %
Neutro Abs: 9.6 10*3/uL — ABNORMAL HIGH (ref 1.7–7.7)
Neutrophils Relative %: 80 %
Platelets: 313 10*3/uL (ref 150–400)
RBC: 4.65 MIL/uL (ref 3.87–5.11)
RDW: 13.7 % (ref 11.5–15.5)
WBC: 12.1 10*3/uL — ABNORMAL HIGH (ref 4.0–10.5)
nRBC: 0 % (ref 0.0–0.2)

## 2022-02-08 LAB — COMPREHENSIVE METABOLIC PANEL
ALT: 37 U/L (ref 0–44)
AST: 28 U/L (ref 15–41)
Albumin: 3.9 g/dL (ref 3.5–5.0)
Alkaline Phosphatase: 77 U/L (ref 38–126)
Anion gap: 12 (ref 5–15)
BUN: 11 mg/dL (ref 6–20)
CO2: 25 mmol/L (ref 22–32)
Calcium: 9.7 mg/dL (ref 8.9–10.3)
Chloride: 103 mmol/L (ref 98–111)
Creatinine, Ser: 0.69 mg/dL (ref 0.44–1.00)
GFR, Estimated: >60 mL/min (ref >60)
Glucose, Bld: 110 mg/dL — ABNORMAL HIGH (ref 70–99)
Potassium: 3.4 mmol/L — ABNORMAL LOW (ref 3.5–5.1)
Sodium: 140 mmol/L (ref 135–145)
Total Bilirubin: 0.6 mg/dL (ref 0.3–1.2)
Total Protein: 7.6 g/dL (ref 6.5–8.1)

## 2022-02-08 LAB — URINALYSIS, ROUTINE W REFLEX MICROSCOPIC
Bilirubin Urine: NEGATIVE
Glucose, UA: NEGATIVE mg/dL
Ketones, ur: NEGATIVE mg/dL
Nitrite: NEGATIVE
Protein, ur: 100 mg/dL — AB
RBC / HPF: >50 RBC/hpf — ABNORMAL HIGH (ref 0–5)
Specific Gravity, Urine: 1.011 (ref 1.005–1.030)
WBC, UA: >50 WBC/hpf — ABNORMAL HIGH (ref 0–5)
pH: 8 (ref 5.0–8.0)

## 2022-02-08 LAB — I-STAT BETA HCG BLOOD, ED (MC, WL, AP ONLY): I-stat hCG, quantitative: <5 m[IU]/mL (ref <5)

## 2022-02-08 LAB — LIPASE, BLOOD: Lipase: 28 U/L (ref 11–51)

## 2022-02-08 MED ORDER — OXYCODONE-ACETAMINOPHEN 5-325 MG PO TABS
1.0000 | ORAL_TABLET | Freq: Once | ORAL | Status: AC
  Administered 2022-02-09: 1 via ORAL
  Filled 2022-02-08: qty 1

## 2022-02-08 MED ORDER — KETOROLAC TROMETHAMINE 15 MG/ML IJ SOLN
15.0000 mg | Freq: Once | INTRAMUSCULAR | Status: AC
  Administered 2022-02-08: 15 mg via INTRAMUSCULAR
  Filled 2022-02-08: qty 1

## 2022-02-08 MED ORDER — CEPHALEXIN 500 MG PO CAPS: 500.0000 mg | ORAL_CAPSULE | Freq: Four times a day (QID) | ORAL | 0 refills | Status: DC

## 2022-02-08 MED ORDER — OXYCODONE-ACETAMINOPHEN 5-325 MG PO TABS: 1.0000 | ORAL_TABLET | Freq: Three times a day (TID) | ORAL | 0 refills | Status: AC | PRN

## 2022-02-08 NOTE — ED Notes (Signed)
Lisa from microbiology reports that she cannot add on the urine culture because the "grey top is the first preference" and she is unable to use the sample sent down previously.

## 2022-02-08 NOTE — ED Notes (Signed)
Provider notified that the patient's urine culture cannot be added on and the patient notified that we will need a new sample per Misty Stanley from microbiology

## 2022-02-08 NOTE — ED Triage Notes (Signed)
Pt c/o LLQ pain x 3 days, worsening today. C/o nausea, denies vomiting/diarrhea. Hx appendectomy.

## 2022-02-08 NOTE — ED Provider Notes (Signed)
[facility] EMERGENCY DEPARTMENT Provider Note   CSN: [id number] Arrival date & time: 02/08/22  1938     History  Chief Complaint  Patient presents with   Abdominal Pain    Savannah Warren is a 56 y.o. female.   Abdominal Pain Patient presents with abdominal pain.  Dysuria.  Left lower abdomen.  Has had for around 3 days.  States that is moved around a little bit.  No vaginal bleeding or discharge.    History reviewed. No pertinent past medical history.  Home Medications Prior to Admission medications   Medication Sig Start Date End Date Taking? Authorizing Provider  cephALEXin (KEFLEX) 500 MG capsule Take 1 capsule (500 mg total) by mouth 4 (four) times daily. 02/08/22  Yes Benjiman Core, MD  oxyCODONE-acetaminophen (PERCOCET/ROXICET) 5-325 MG tablet Take 1-2 tablets by mouth every 8 (eight) hours as needed for severe pain. 02/08/22  Yes Benjiman Core, MD  acetaminophen (TYLENOL) 325 MG tablet Take 2 tablets (650 mg total) by mouth every 6 (six) hours as needed for up to 30 doses for mild pain or moderate pain. 12/27/20   Terald Sleeper, MD  docusate sodium (COLACE) 100 MG capsule Take 1 capsule (100 mg total) by mouth daily as needed for mild constipation. 12/27/20   Terald Sleeper, MD  LORazepam (ATIVAN) 1 MG tablet Take 1 tablet (1 mg total) by mouth 2 (two) times daily as needed for anxiety. 01/15/22   Jacalyn Lefevre, MD  meloxicam (MOBIC) 7.5 MG tablet Take 7.5 mg by mouth 2 (two) times daily. 03/28/18   [provider]  oxyCODONE (ROXICODONE) 5 MG immediate release tablet Take 1 tablet (5 mg total) by mouth every 4 (four) hours as needed for up to 20 doses for severe pain. 12/27/20   Terald Sleeper, MD  predniSONE (DELTASONE) 10 MG tablet Take q day 6,5,4,3,2,1 10/25/21   Mancel Bale, MD      Allergies    Patient has no known allergies.    Review of Systems   Review of Systems  Gastrointestinal:  Positive for abdominal pain.     Physical Exam Updated Vital Signs BP 104/78   Pulse 95   Temp 98.4 F (36.9 C) (Oral)   Resp 20   SpO2 100%  Physical Exam Vitals and nursing note reviewed.  Eyes:     Pupils: Pupils are equal, round, and reactive to light.  Pulmonary:     Breath sounds: Normal breath sounds.  Abdominal:     General: Bowel sounds are normal.     Comments: Mild left lower abdominal tenderness.  No rebound or guarding.  No hernia palpated.  Skin:    Capillary Refill: Capillary refill takes less than 2 seconds.  Neurological:     Mental Status: She is alert.     ED Results / Procedures / Treatments   Labs (all labs ordered are listed, but only abnormal results are displayed) Labs Reviewed  CBC WITH DIFFERENTIAL/PLATELET - Abnormal; Notable for the following components:      Result Value   WBC 12.1 (*)    Neutro Abs 9.6 (*)    All other components within normal limits  COMPREHENSIVE METABOLIC PANEL - Abnormal; Notable for the following components:   Potassium 3.4 (*)    Glucose, Bld 110 (*)    All other components within normal limits  URINALYSIS, ROUTINE W REFLEX MICROSCOPIC - Abnormal; Notable for the following components:   APPearance CLOUDY (*)    Hgb urine  dipstick MODERATE (*)    Protein, ur 100 (*)    Leukocytes,Ua LARGE (*)    RBC / HPF >50 (*)    WBC, UA >50 (*)    Bacteria, UA FEW (*)    All other components within normal limits  URINE CULTURE  LIPASE, BLOOD  I-STAT BETA HCG BLOOD, ED ([facility], [facility], [facility] ONLY)    EKG None  Radiology CT Renal Stone Study  Result Date: 02/08/2022 CLINICAL DATA:  Left side flank pain EXAM: CT ABDOMEN AND PELVIS WITHOUT CONTRAST TECHNIQUE: Multidetector CT imaging of the abdomen and pelvis was performed following the standard protocol without IV contrast. RADIATION DOSE REDUCTION: This exam was performed according to the departmental dose-optimization program which includes automated exposure control, adjustment of the mA and/or kV according  to patient size and/or use of iterative reconstruction technique. COMPARISON:  12/27/2020 FINDINGS: Lower chest: No acute abnormality Hepatobiliary: No focal hepatic abnormality. Gallbladder unremarkable. Pancreas: No focal abnormality or ductal dilatation. Spleen: No focal abnormality.  Normal size. Adrenals/Urinary Tract: No adrenal abnormality. No focal renal abnormality. No stones or hydronephrosis. Urinary bladder is unremarkable. Stomach/Bowel: Stomach, large and small bowel grossly unremarkable. Vascular/Lymphatic: No evidence of aneurysm or adenopathy. Reproductive: Uterus and adnexa unremarkable.  No mass. Other: No free fluid or free air. Musculoskeletal: No acute bony abnormality. IMPRESSION: No renal or ureteral stones.  No hydronephrosis. No acute findings in the abdomen or pelvis. Electronically Signed   By: Charlett Nose M.D.   On: 02/08/2022 21:44    Procedures Procedures    Medications Ordered in ED Medications  ketorolac (TORADOL) 15 MG/ML injection 15 mg (15 mg Intramuscular Given 02/08/22 2310)    ED Course/ Medical Decision Making/ A&P                           Medical Decision Making Amount and/or Complexity of Data Reviewed Labs: ordered.  Risk Prescription drug management.   Patient with left lower abdominal pain.  Differential diagnosis includes UTI, diverticulitis, kidney stone, musculoskeletal pain.  However urine shows likely infection.  CT scan does not show hydronephrosis stone or other clear cause of pain.  Good kidney function.  White count mildly elevated.  Attempted urine culture but reported the lab would need a new sample.  We will still attempt to get that.  We will give IM Toradol and discharged home with pain meds.  Care will be turned over to Dr. Oletta Cohn.        Final Clinical Impression(s) / ED Diagnoses Final diagnoses:  Acute cystitis with hematuria    Rx / DC Orders ED Discharge Orders          Ordered    cephALEXin (KEFLEX) 500 MG  capsule  4 times daily        02/08/22 2312    oxyCODONE-acetaminophen (PERCOCET/ROXICET) 5-325 MG tablet  Every 8 hours PRN        02/08/22 2312              Benjiman Core, MD 02/08/22 2312

## 2022-02-08 NOTE — ED Provider Triage Note (Signed)
Emergency Medicine Provider Triage Evaluation Note  Savannah Warren , a 56 y.o. female  was evaluated in triage.  Pt complains of left lower abdominal pain with dysuria and frequency x3 days, worse today since noon.  Review of Systems  Positive: Dysuria, frequency Negative: Fever, chills, nausea, vomiting  Physical Exam  BP (!) 152/137 (BP Location: Right Arm)   Pulse 95   Temp 98.4 F (36.9 C) (Oral)   Resp 20   SpO2 100%  Gen:   Awake, no distress   Resp:  Normal effort  MSK:   Moves extremities without difficulty  Other:  Tearful, no CVA tenderness, mild left lower quadrant tenderness.  Medical Decision Making  Medically screening exam initiated at 8:20 PM.  Appropriate orders placed.  Dymin Dingledine was informed that the remainder of the evaluation will be completed by another provider, this initial triage assessment does not replace that evaluation, and the importance of remaining in the ED until their evaluation is complete.     Jeannie Fend, PA-C 02/08/22 2020

## 2022-02-10 LAB — URINE CULTURE: Culture: >=100000 — AB

## 2022-02-11 ENCOUNTER — Telehealth (HOSPITAL_BASED_OUTPATIENT_CLINIC_OR_DEPARTMENT_OTHER): Payer: Self-pay

## 2022-02-11 NOTE — Telephone Encounter (Signed)
Post ED Visit - Positive Culture Follow-up  Culture report reviewed by antimicrobial stewardship pharmacist: [facility] [x]  , Pharm.D. []  Larena Sox, Pharm.D., BCPS AQ-ID []  , Pharm.D., BCPS []  Celedonio Miyamoto, Pharm.D., BCPS []  [place], Garvin Fila.D., BCPS, AAHIVP []  , Pharm.D., BCPS, AAHIVP []  Georgina Pillion, PharmD, BCPS []  , PharmD, BCPS []  [place], PharmD, BCPS []  [address], PharmD []  , PharmD, BCPS []  Estella Husk, PharmD  Pharmacy Team []  Lysle Pearl, PharmD []  , PharmD []  Phillips Climes, PharmD []  , Rph []  Agapito Games) , PharmD []  Verlan Friends, PharmD []  , PharmD []  Mervyn Gay, PharmD []  , PharmD []  Vinnie Level, PharmD []  [facility], PharmD []  , PharmD []  Len Childs, PharmD   Positive urine culture Treated with Cephalexin, organism sensitive to the same and no further patient follow-up is required at this time.  02/11/2022, 11:36 AM

## 2022-04-09 ENCOUNTER — Emergency Department (HOSPITAL_COMMUNITY): Payer: Self-pay

## 2022-04-09 ENCOUNTER — Other Ambulatory Visit: Payer: Self-pay

## 2022-04-09 ENCOUNTER — Emergency Department (HOSPITAL_COMMUNITY)
Admission: EM | Admit: 2022-04-09 | Discharge: 2022-04-09 | Disposition: A | Discharge status: Home / Self Care | Payer: Self-pay | Attending: Emergency Medicine | Admitting: Emergency Medicine

## 2022-04-09 ENCOUNTER — Encounter (HOSPITAL_COMMUNITY): Payer: Self-pay | Admitting: Emergency Medicine

## 2022-04-09 DIAGNOSIS — R1011 Right upper quadrant pain: Secondary | ICD-10-CM

## 2022-04-09 DIAGNOSIS — N3090 Cystitis, unspecified without hematuria: Secondary | ICD-10-CM | POA: Insufficient documentation

## 2022-04-09 LAB — COMPREHENSIVE METABOLIC PANEL
ALT: 24 U/L (ref 0–44)
AST: 23 U/L (ref 15–41)
Albumin: 3.6 g/dL (ref 3.5–5.0)
Alkaline Phosphatase: 64 U/L (ref 38–126)
Anion gap: 13 (ref 5–15)
BUN: 16 mg/dL (ref 6–20)
CO2: 23 mmol/L (ref 22–32)
Calcium: 9.1 mg/dL (ref 8.9–10.3)
Chloride: 101 mmol/L (ref 98–111)
Creatinine, Ser: 0.66 mg/dL (ref 0.44–1.00)
GFR, Estimated: >60 mL/min (ref >60)
Glucose, Bld: 99 mg/dL (ref 70–99)
Potassium: 3.9 mmol/L (ref 3.5–5.1)
Sodium: 137 mmol/L (ref 135–145)
Total Bilirubin: 0.1 mg/dL — ABNORMAL LOW (ref 0.3–1.2)
Total Protein: 6.9 g/dL (ref 6.5–8.1)

## 2022-04-09 LAB — CBC WITH DIFFERENTIAL/PLATELET
Abs Immature Granulocytes: 0.02 10*3/uL (ref 0.00–0.07)
Basophils Absolute: 0 10*3/uL (ref 0.0–0.1)
Basophils Relative: 0 %
Eosinophils Absolute: 0.1 10*3/uL (ref 0.0–0.5)
Eosinophils Relative: 2 %
HCT: 38.1 % (ref 36.0–46.0)
Hemoglobin: 12.1 g/dL (ref 12.0–15.0)
Immature Granulocytes: 0 %
Lymphocytes Relative: 24 %
Lymphs Abs: 1.7 10*3/uL (ref 0.7–4.0)
MCH: 28 pg (ref 26.0–34.0)
MCHC: 31.8 g/dL (ref 30.0–36.0)
MCV: 88.2 fL (ref 80.0–100.0)
Monocytes Absolute: 0.5 10*3/uL (ref 0.1–1.0)
Monocytes Relative: 7 %
Neutro Abs: 4.8 10*3/uL (ref 1.7–7.7)
Neutrophils Relative %: 67 %
Platelets: 274 10*3/uL (ref 150–400)
RBC: 4.32 MIL/uL (ref 3.87–5.11)
RDW: 13.4 % (ref 11.5–15.5)
WBC: 7.1 10*3/uL (ref 4.0–10.5)
nRBC: 0 % (ref 0.0–0.2)

## 2022-04-09 LAB — URINALYSIS, ROUTINE W REFLEX MICROSCOPIC
Bilirubin Urine: NEGATIVE
Glucose, UA: NEGATIVE mg/dL
Hgb urine dipstick: NEGATIVE
Ketones, ur: NEGATIVE mg/dL
Nitrite: NEGATIVE
Protein, ur: NEGATIVE mg/dL
Specific Gravity, Urine: 1.016 (ref 1.005–1.030)
pH: 6 (ref 5.0–8.0)

## 2022-04-09 LAB — LIPASE, BLOOD: Lipase: 38 U/L (ref 11–51)

## 2022-04-09 LAB — TROPONIN I (HIGH SENSITIVITY): Troponin I (High Sensitivity): 4 ng/L (ref <18)

## 2022-04-09 MED ORDER — METHOCARBAMOL 500 MG PO TABS
750.0000 mg | ORAL_TABLET | Freq: Once | ORAL | Status: AC
  Administered 2022-04-09: 750 mg via ORAL
  Filled 2022-04-09: qty 2

## 2022-04-09 MED ORDER — LIDOCAINE VISCOUS HCL 2 % MT SOLN
15.0000 mL | Freq: Once | OROMUCOSAL | Status: AC
  Administered 2022-04-09: 15 mL via ORAL
  Filled 2022-04-09: qty 15

## 2022-04-09 MED ORDER — CEPHALEXIN 500 MG PO CAPS: 500.0000 mg | ORAL_CAPSULE | Freq: Three times a day (TID) | ORAL | 0 refills | Status: AC

## 2022-04-09 MED ORDER — HYDROCODONE-ACETAMINOPHEN 5-325 MG PO TABS
1.0000 | ORAL_TABLET | Freq: Once | ORAL | Status: AC
  Administered 2022-04-09: 1 via ORAL
  Filled 2022-04-09: qty 1

## 2022-04-09 MED ORDER — ALUM & MAG HYDROXIDE-SIMETH 200-200-20 MG/5ML PO SUSP
30.0000 mL | Freq: Once | ORAL | Status: AC
  Administered 2022-04-09: 30 mL via ORAL
  Filled 2022-04-09: qty 30

## 2022-04-09 MED ORDER — IOHEXOL 350 MG/ML SOLN
75.0000 mL | Freq: Once | INTRAVENOUS | Status: AC | PRN
  Administered 2022-04-09: 75 mL via INTRAVENOUS

## 2022-04-09 MED ORDER — FAMOTIDINE IN NACL 20-0.9 MG/50ML-% IV SOLN
20.0000 mg | Freq: Once | INTRAVENOUS | Status: AC
  Administered 2022-04-09: 20 mg via INTRAVENOUS
  Filled 2022-04-09: qty 50

## 2022-04-09 MED ORDER — OXYCODONE-ACETAMINOPHEN 5-325 MG PO TABS
1.0000 | ORAL_TABLET | Freq: Once | ORAL | Status: AC
  Administered 2022-04-09: 1 via ORAL
  Filled 2022-04-09: qty 1

## 2022-04-09 NOTE — ED Notes (Signed)
Patient transported to CT 

## 2022-04-09 NOTE — Discharge Instructions (Addendum)
There was a prescription for an antibiotic that was sent to your pharmacy.  This is for treatment of a urine infection.  Results of your urine culture will be available in 2 days.  Please follow-up on these results with your primary care doctor for possible changes or discontinuation of your antibiotic.

## 2022-04-09 NOTE — ED Provider Triage Note (Addendum)
Emergency Medicine Provider Triage Evaluation Note  Savannah Warren , a 55 y.o. female  was evaluated in triage.  Pt complains of RUQ pain x 8 days, worse over the last 1-2 days. Pain is worse with deep breathing. She has tried Tylenol w/o relief. States that pain feels similar to past gallbladder pain, but gallbladder removed 3 years ago. Also hx of prior appendectomy. Denies recent surgeries, leg swelling, hemoptysis.  Review of Systems  Positive: As above Negative: As above  Physical Exam  BP 116/70   Pulse 69   Temp 97.7 F (36.5 C) (Oral)   Resp 16   SpO2 98%  Gen:   Awake, no distress   Resp:  Normal effort  MSK:   Moves extremities without difficulty  Other:  Mild RUQ TTP without guarding. No peritoneal signs. Ambulatory with steady gait.  Medical Decision Making  Medically screening exam initiated at 5:46 AM.  Appropriate orders placed.  Rocsi Hazelbaker was informed that the remainder of the evaluation will be completed by another provider, this initial triage assessment does not replace that evaluation, and the importance of remaining in the ED until their evaluation is complete.  RUQ pain   Antony Madura, PA-C 04/09/22 0547    Antony Madura, PA-C 04/09/22 [phone]

## 2022-04-09 NOTE — ED Provider Notes (Signed)
[facility] EMERGENCY DEPARTMENT Provider Note   CSN: [id number] Arrival date & time: 04/09/22  1740     History  Chief Complaint  Patient presents with   Ribcage Pain     Savannah Warren is a 56 y.o. female.  HPI Patient presents for right upper quadrant and epigastric pain.  Surgical history includes cholecystectomy and appendectomy.  Over the past week, she has had right upper quadrant pain that is worsened over the past 2 days.  Epigastric pain worsens with deep inspiration.  Pain is not worsened postprandially.  Patient has not had any associated nausea.  She has taken Tylenol at home with minimal relief.    Home Medications Prior to Admission medications   Medication Sig Start Date End Date Taking? Authorizing Provider  cephALEXin (KEFLEX) 500 MG capsule Take 1 capsule (500 mg total) by mouth 3 (three) times daily for 5 days. 04/09/22 04/14/22 Yes Gloris Manchester, MD  acetaminophen (TYLENOL) 325 MG tablet Take 2 tablets (650 mg total) by mouth every 6 (six) hours as needed for up to 30 doses for mild pain or moderate pain. 12/27/20   Terald Sleeper, MD  docusate sodium (COLACE) 100 MG capsule Take 1 capsule (100 mg total) by mouth daily as needed for mild constipation. 12/27/20   Terald Sleeper, MD  LORazepam (ATIVAN) 1 MG tablet Take 1 tablet (1 mg total) by mouth 2 (two) times daily as needed for anxiety. 01/15/22   Jacalyn Lefevre, MD  meloxicam (MOBIC) 7.5 MG tablet Take 7.5 mg by mouth 2 (two) times daily. 03/28/18   [provider]  oxyCODONE (ROXICODONE) 5 MG immediate release tablet Take 1 tablet (5 mg total) by mouth every 4 (four) hours as needed for up to 20 doses for severe pain. 12/27/20   Terald Sleeper, MD  oxyCODONE-acetaminophen (PERCOCET/ROXICET) 5-325 MG tablet Take 1-2 tablets by mouth every 8 (eight) hours as needed for severe pain. 02/08/22   Benjiman Core, MD  predniSONE (DELTASONE) 10 MG tablet Take q day 6,5,4,3,2,1 10/25/21    Mancel Bale, MD      Allergies    Patient has no known allergies.    Review of Systems   Review of Systems  Gastrointestinal:  Positive for abdominal pain.  All other systems reviewed and are negative.   Physical Exam Updated Vital Signs BP 121/74 (BP Location: Right Arm)   Pulse 61   Temp (!) 97.5 F (36.4 C) (Oral)   Resp 15   SpO2 96%  Physical Exam Vitals and nursing note reviewed.  Constitutional:      General: She is not in acute distress.    Appearance: Normal appearance. She is well-developed. She is not ill-appearing, toxic-appearing or diaphoretic.  HENT:     Head: Normocephalic and atraumatic.     Right Ear: External ear normal.     Left Ear: External ear normal.     Nose: Nose normal.     Mouth/Throat:     Mouth: Mucous membranes are moist.     Pharynx: Oropharynx is clear.  Eyes:     Extraocular Movements: Extraocular movements intact.     Conjunctiva/sclera: Conjunctivae normal.  Cardiovascular:     Rate and Rhythm: Normal rate and regular rhythm.     Heart sounds: No murmur heard. Pulmonary:     Effort: Pulmonary effort is normal. No respiratory distress.     Breath sounds: Normal breath sounds.  Abdominal:     Palpations: Abdomen is soft.  Tenderness: There is abdominal tenderness. There is no right CVA tenderness, left CVA tenderness, guarding or rebound.  Musculoskeletal:        General: No swelling. Normal range of motion.     Cervical back: Normal range of motion and neck supple.     Right lower leg: No edema.     Left lower leg: No edema.  Skin:    General: Skin is warm and dry.     Capillary Refill: Capillary refill takes less than 2 seconds.     Coloration: Skin is not jaundiced or pale.  Neurological:     General: No focal deficit present.     Mental Status: She is alert and oriented to person, place, and time.     Cranial Nerves: No cranial nerve deficit.     Sensory: No sensory deficit.     Motor: No weakness.      Coordination: Coordination normal.  Psychiatric:        Mood and Affect: Mood normal.        Behavior: Behavior normal.        Thought Content: Thought content normal.        Judgment: Judgment normal.     ED Results / Procedures / Treatments   Labs (all labs ordered are listed, but only abnormal results are displayed) Labs Reviewed  COMPREHENSIVE METABOLIC PANEL - Abnormal; Notable for the following components:      Result Value   Total Bilirubin 0.1 (*)    All other components within normal limits  URINALYSIS, ROUTINE W REFLEX MICROSCOPIC - Abnormal; Notable for the following components:   Leukocytes,Ua SMALL (*)    Bacteria, UA RARE (*)    All other components within normal limits  URINE CULTURE  CBC WITH DIFFERENTIAL/PLATELET  LIPASE, BLOOD  TROPONIN I (HIGH SENSITIVITY)    EKG None  Radiology CT ABDOMEN PELVIS W CONTRAST  Result Date: 04/09/2022 CLINICAL DATA:  Abdominal pain in right upper quadrant EXAM: CT ABDOMEN AND PELVIS WITH CONTRAST TECHNIQUE: Multidetector CT imaging of the abdomen and pelvis was performed using the standard protocol following bolus administration of intravenous contrast. RADIATION DOSE REDUCTION: This exam was performed according to the departmental dose-optimization program which includes automated exposure control, adjustment of the mA and/or kV according to patient size and/or use of iterative reconstruction technique. CONTRAST:  18mL OMNIPAQUE IOHEXOL 350 MG/ML SOLN COMPARISON:  02/09/2022 FINDINGS: Lower chest: Unremarkable. Hepatobiliary: There is previous cholecystectomy. There is 9 mm low-density in the left lobe, possibly a cyst or hemangioma with no interval change. There is no dilation of bile ducts. Pancreas: No focal abnormality is seen. Spleen: Unremarkable. Adrenals/Urinary Tract: Adrenals are unremarkable. There is no hydronephrosis. There are no renal or ureteral stones. Urinary bladder is unremarkable. Stomach/Bowel: Stomach is  unremarkable. Small bowel loops are not dilated. Appendix is not seen. There is no significant wall thickening in colon. Few diverticula are seen in colon without signs of focal diverticulitis. Vascular/Lymphatic: Vascular structures are unremarkable. There are subcentimeter mesenteric nodes along with mild stranding in the mesenteric fat. This may be due to chronic inflammation. No interval changes are noted. Reproductive: Unremarkable. Other: There is no ascites or pneumoperitoneum. Small umbilical hernia containing fat is seen. Musculoskeletal: Unremarkable. IMPRESSION: No acute findings are seen CT scan of abdomen and pelvis. There is no evidence of intestinal obstruction or pneumoperitoneum. There is no hydronephrosis. Other findings as described in the body of the report. Electronically Signed   By: Harlan Stains.D.  On: 04/09/2022 14:25   CT Angio Chest PE W and/or Wo Contrast  Result Date: 04/09/2022 CLINICAL DATA:  Pain right upper quadrant of abdomen, high clinical suspicion for PE EXAM: CT ANGIOGRAPHY CHEST WITH CONTRAST TECHNIQUE: Multidetector CT imaging of the chest was performed using the standard protocol during bolus administration of intravenous contrast. Multiplanar CT image reconstructions and MIPs were obtained to evaluate the vascular anatomy. RADIATION DOSE REDUCTION: This exam was performed according to the departmental dose-optimization program which includes automated exposure control, adjustment of the mA and/or kV according to patient size and/or use of iterative reconstruction technique. CONTRAST:  47mL OMNIPAQUE IOHEXOL 350 MG/ML SOLN COMPARISON:  05/22/2019 FINDINGS: Cardiovascular: There are no intraluminal filling defects in pulmonary artery branches. Contrast density in thoracic aorta lumen is less than adequate to evaluate for intimal flap. Mediastinum/Nodes: No significant lymphadenopathy seen. Lungs/Pleura: There is no focal pulmonary consolidation. There is no  pleural effusion or pneumothorax. There are linear densities in both apices with no significant interval change suggesting scarring. Upper Abdomen: There is fatty infiltration in liver. Surgical clips are seen in gallbladder fossa. Musculoskeletal: Unremarkable. Review of the MIP images confirms the above findings. IMPRESSION: There is no evidence of pulmonary artery embolism. There is no focal pulmonary consolidation. There is no pleural effusion or pneumothorax. Linear densities in both apices suggest scarring with no significant interval change. Electronically Signed   By: Ernie Avena M.D.   On: 04/09/2022 14:18   DG Chest 2 View  Result Date: 04/09/2022 CLINICAL DATA:  Right lower chest pain. EXAM: CHEST - 2 VIEW COMPARISON:  02/09/2022 FINDINGS: Architectural distortion/scarring in the right apex is stable and was present on CT chest 05/22/2019. No focal airspace consolidation or pulmonary edema. No pleural effusion. The cardiopericardial silhouette is within normal limits for size. The visualized bony structures of the thorax are unremarkable. IMPRESSION: Stable exam. No acute cardiopulmonary findings. Electronically Signed   By: Kennith Center M.D.   On: 04/09/2022 06:35    Procedures Procedures    Medications Ordered in ED Medications  HYDROcodone-acetaminophen (NORCO/VICODIN) 5-325 MG per tablet 1 tablet (1 tablet Oral Given 04/09/22 0553)  oxyCODONE-acetaminophen (PERCOCET/ROXICET) 5-325 MG per tablet 1 tablet (1 tablet Oral Given 04/09/22 1208)  iohexol (OMNIPAQUE) 350 MG/ML injection 75 mL (75 mLs Intravenous Contrast Given 04/09/22 1409)  alum & mag hydroxide-simeth (MAALOX/MYLANTA) 200-200-20 MG/5ML suspension 30 mL (30 mLs Oral Given 04/09/22 1448)    And  lidocaine (XYLOCAINE) 2 % viscous mouth solution 15 mL (15 mLs Oral Given 04/09/22 1449)  famotidine (PEPCID) IVPB 20 mg premix (0 mg Intravenous Stopped 04/09/22 1559)  methocarbamol (ROBAXIN) tablet 750 mg (750 mg Oral  Given 04/09/22 1650)    ED Course/ Medical Decision Making/ A&P                           Medical Decision Making Amount and/or Complexity of Data Reviewed Labs: ordered. Radiology: ordered.  Risk OTC drugs. Prescription drug management.   This patient presents to the ED for concern of right upper quadrant and epigastric pain, this involves an extensive number of treatment options, and is a complaint that carries with it a high risk of complications and morbidity.  The differential diagnosis includes pneumonia, hepatitis, cholangitis, gastritis, ACS   Co morbidities that complicate the patient evaluation  Prior cholecystectomy and appendectomy   Additional history obtained:  Additional history obtained from N/A External records from outside source obtained and reviewed including EMR  Lab Tests:  I Ordered, and personally interpreted labs.  The pertinent results include: Normal hepatobiliary enzymes, normal hemoglobin, no leukocytosis, normal electrolytes, urinalysis with rare bacteria and pyuria.  Imaging Studies ordered:  I ordered imaging studies including chest x-ray, CTA chest, CT of abdomen pelvis I independently visualized and interpreted imaging which showed no acute findings on chest x-ray, CT imaging showed no acute findings to explain her symptoms. I agree with the radiologist interpretation   Cardiac Monitoring: / EKG:  The patient was maintained on a cardiac monitor.  I personally viewed and interpreted the cardiac monitored which showed an underlying rhythm of: Sinus rhythm  Problem List / ED Course / Critical interventions / Medication management  Patient is a 56 year old female presenting for 1 week of right upper quadrant/epigastric pain.  Pain is worsened with deep inspiration.  It is not worsened postprandially and she has not had associated nausea.  Patient is s/p cholecystectomy 3 years ago.  Prior to being bedded in the ED, initial diagnostic  work-up was obtained.  Chest x-ray did not show acute findings.  Laboratory work-up is reassuring.  Although her urine shows bacteria and pyuria, patient initially denies any recent urine symptoms.  On arrival in the ED, patient is well-appearing.  She does have mild tenderness without guarding to areas of right upper quadrant and epigastrium.  Given her pleuritic pain, patient to undergo CTA of chest.  Given concern of intra-abdominal source, CT of abdomen and pelvis was ordered as well.  Percocet was ordered for analgesia.  Imaging studies show no acute findings.  She does have fatty infiltration of the liver which may cause some capsular stretch but this is unlikely.  Of note, she has no abnormal hepatobiliary enzymes.  Patient was given GI cocktail for empiric treatment of gastritis.  She did not experience any relief of symptoms after this.  Patient was given Robaxin for musculoskeletal etiology.  She reports no relief with this either.  I discussed the urinalysis findings with the patient.  When speaking with her further, she does state that she has had some very mild dysuria lately.  Patient to be prescribed Keflex for empiric treatment of UTI.  She was advised to follow-up on results of urine culture for possible changes to antibiotic or discontinuation of antibiotic.  Patient was discharged in stable condition. I ordered medication including Percocet, GI cocktail, Robaxin for analgesia Reevaluation of the patient after these medicines showed that the patient stayed the same I have reviewed the patients home medicines and have made adjustments as needed   Social Determinants of Health:  Has PCP        Final Clinical Impression(s) / ED Diagnoses Final diagnoses:  RUQ pain  Cystitis    Rx / DC Orders ED Discharge Orders          Ordered    cephALEXin (KEFLEX) 500 MG capsule  3 times daily        04/09/22 1710              Gloris Manchesterixon, Tyvion Edmondson, MD 04/09/22 1711

## 2022-04-09 NOTE — ED Triage Notes (Signed)
Patient reports right low ribcage pain for 8 days , denies injury/respirations unlabored .

## 2022-04-10 LAB — URINE CULTURE

## 2022-09-19 IMAGING — CR DG CHEST 2V
2 series · 2 of 2 positions shown · non-contrast
Comparison: 12/05/2018

CLINICAL DATA: Chest pain

EXAM:
CHEST - 2 VIEW

[chest pa]
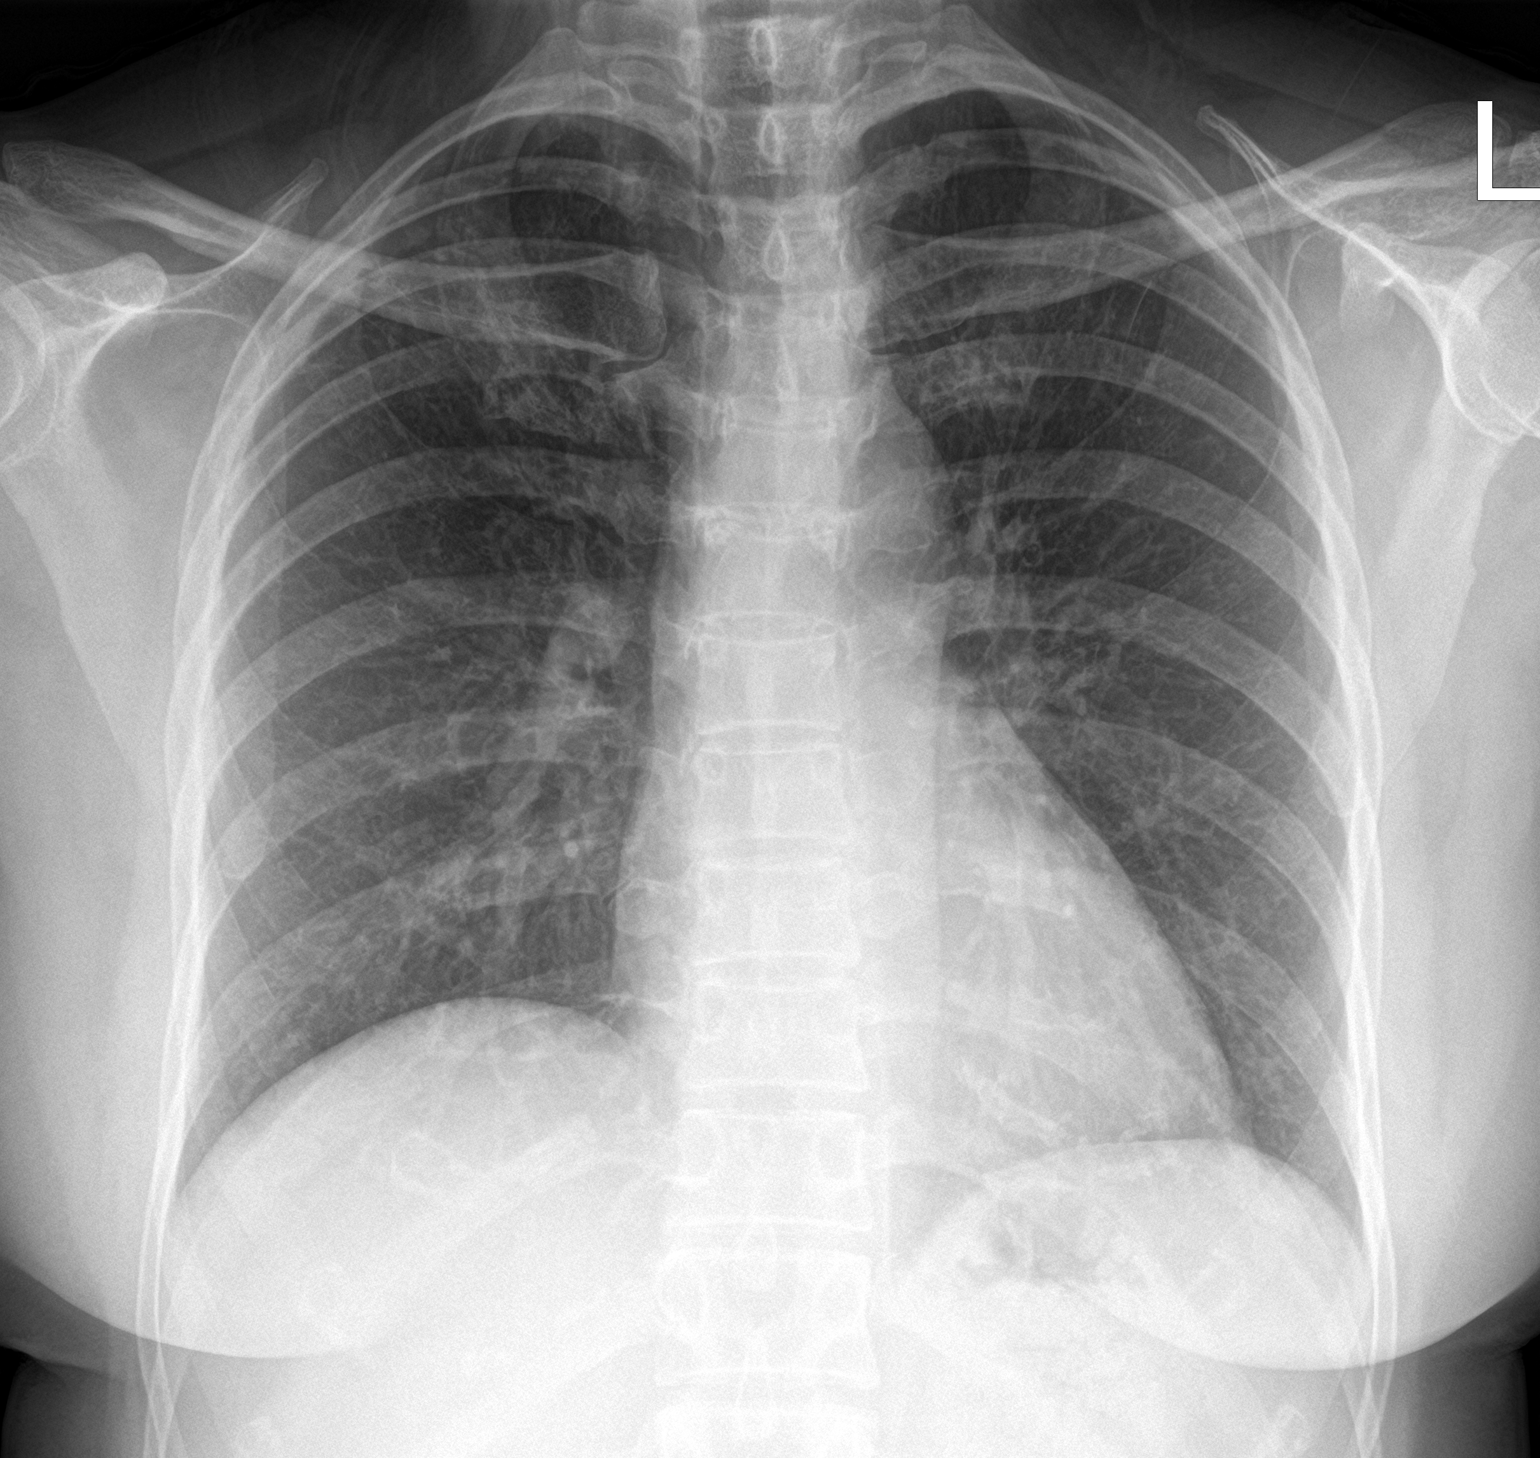

[chest lat]
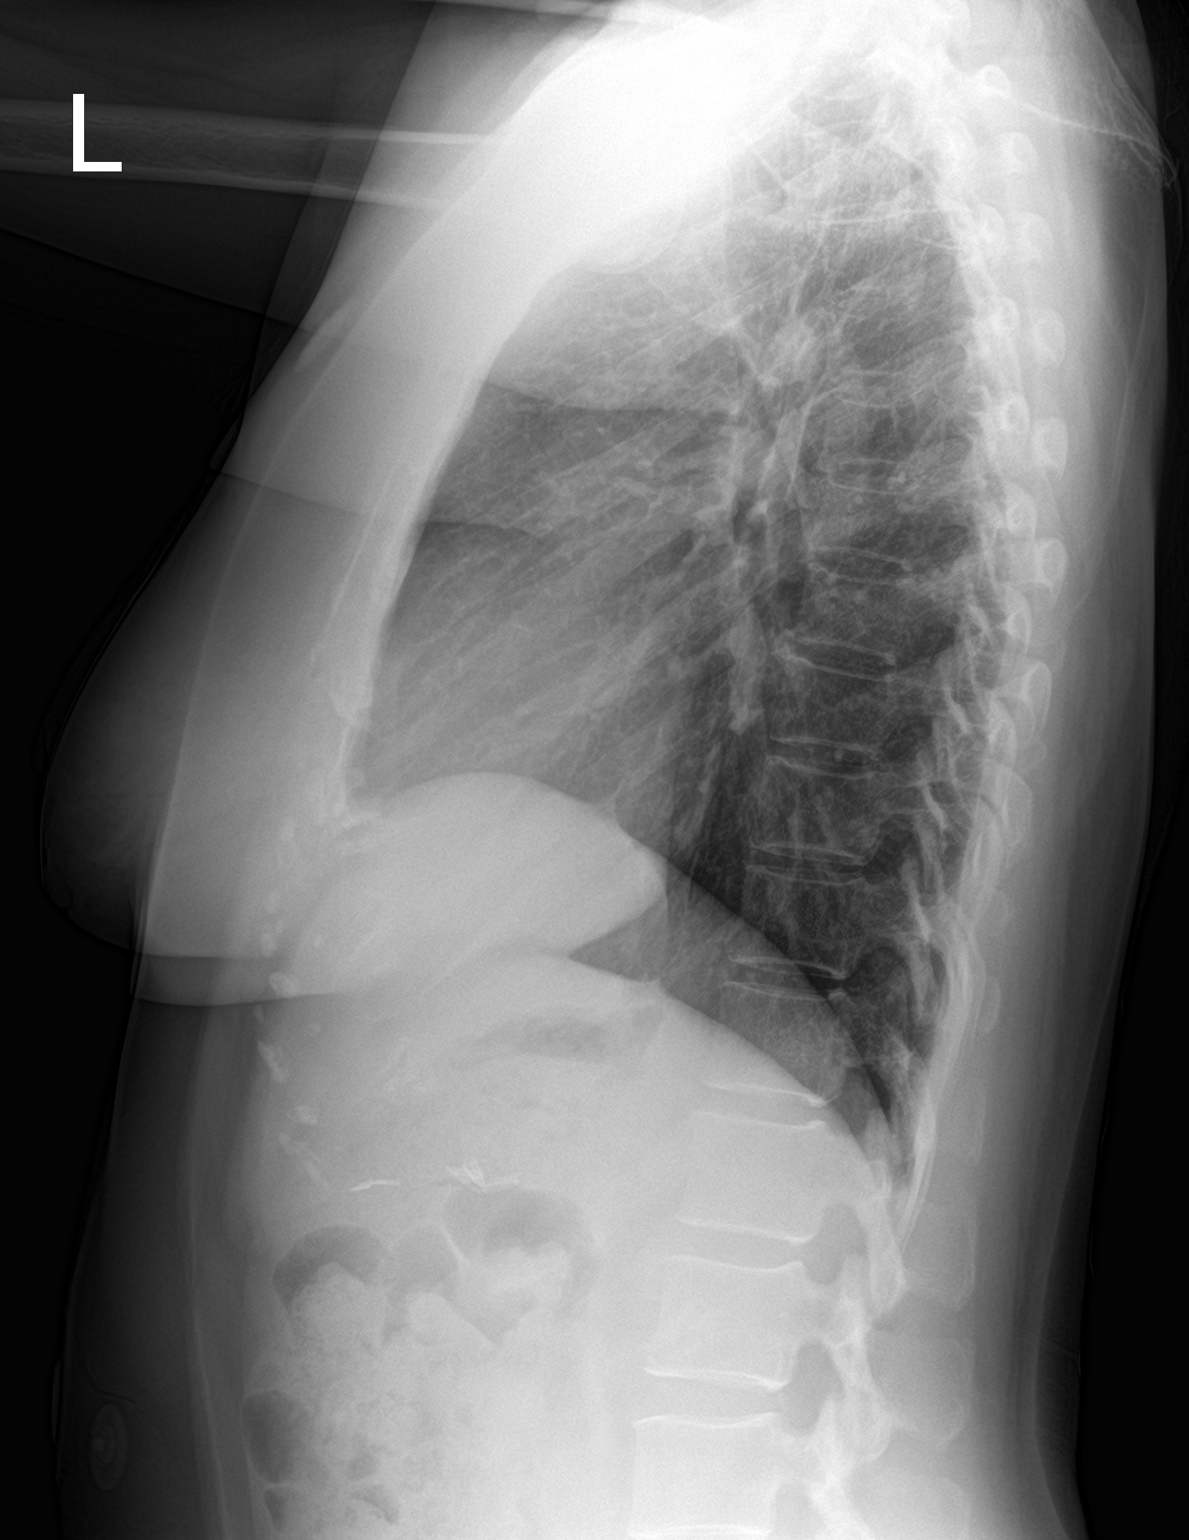

[2 of 2 positions shown; findings below may reference images not displayed]

FINDINGS: Heart and mediastinal contours are within normal limits. No focal
opacities or effusions. No acute bony abnormality.
IMPRESSION: No active cardiopulmonary disease.
